# Patient Record
Sex: Female | Born: 1975 | Race: Black or African American | Hispanic: No | Marital: Single | Smoking: Former smoker
Health system: Southern US, Community
[De-identification: ages and names within clinical notes are randomized; demographics above are authoritative.]

## PROBLEM LIST (undated history)

## (undated) DIAGNOSIS — D219 Benign neoplasm of connective and other soft tissue, unspecified: Secondary | ICD-10-CM

---

## 2004-09-19 ENCOUNTER — Emergency Department: Payer: Self-pay | Admitting: Unknown Physician Specialty

## 2004-11-12 ENCOUNTER — Observation Stay: Payer: Self-pay

## 2005-01-25 ENCOUNTER — Inpatient Hospital Stay: Payer: Self-pay | Admitting: Obstetrics & Gynecology

## 2008-04-15 ENCOUNTER — Emergency Department: Payer: Self-pay | Admitting: Emergency Medicine

## 2008-12-06 ENCOUNTER — Emergency Department: Payer: Self-pay | Admitting: Emergency Medicine

## 2010-02-28 ENCOUNTER — Emergency Department: Payer: Self-pay | Admitting: Emergency Medicine

## 2011-07-22 ENCOUNTER — Emergency Department: Payer: Self-pay | Admitting: Emergency Medicine

## 2011-07-22 LAB — COMPREHENSIVE METABOLIC PANEL
Albumin: 3.8 g/dL (ref 3.4–5.0)
Alkaline Phosphatase: 55 U/L (ref 50–136)
Anion Gap: 9 (ref 7–16)
BUN: 13 mg/dL (ref 7–18)
Bilirubin,Total: 0.2 mg/dL (ref 0.2–1.0)
Calcium, Total: 9 mg/dL (ref 8.5–10.1)
Chloride: 105 mmol/L (ref 98–107)
Co2: 29 mmol/L (ref 21–32)
Creatinine: 0.91 mg/dL (ref 0.60–1.30)
EGFR (African American): >60
EGFR (Non-African Amer.): >60
Glucose: 83 mg/dL (ref 65–99)
Osmolality: 284 (ref 275–301)
Potassium: 3.8 mmol/L (ref 3.5–5.1)
SGOT(AST): 23 U/L (ref 15–37)
SGPT (ALT): 20 U/L
Sodium: 143 mmol/L (ref 136–145)
Total Protein: 8.2 g/dL (ref 6.4–8.2)

## 2011-07-22 LAB — URINALYSIS, COMPLETE
Bacteria: NONE SEEN
Bilirubin,UR: NEGATIVE
Blood: NEGATIVE
Glucose,UR: NEGATIVE mg/dL (ref 0–75)
Ketone: NEGATIVE
Leukocyte Esterase: NEGATIVE
Nitrite: NEGATIVE
Ph: 6 (ref 4.5–8.0)
Protein: NEGATIVE
RBC,UR: NONE SEEN /HPF (ref 0–5)
Specific Gravity: 1.026 (ref 1.003–1.030)
Squamous Epithelial: 1
WBC UR: <1 /HPF (ref 0–5)

## 2011-07-22 LAB — CBC
HCT: 37.1 % (ref 35.0–47.0)
HGB: 12.5 g/dL (ref 12.0–16.0)
MCH: 30.7 pg (ref 26.0–34.0)
MCHC: 33.7 g/dL (ref 32.0–36.0)
MCV: 91 fL (ref 80–100)
Platelet: 202 10*3/uL (ref 150–440)
RBC: 4.08 10*6/uL (ref 3.80–5.20)
RDW: 12.8 % (ref 11.5–14.5)
WBC: 4.9 10*3/uL (ref 3.6–11.0)

## 2011-07-22 LAB — HCG, QUANTITATIVE, PREGNANCY: Beta Hcg, Quant.: 2875 m[IU]/mL — ABNORMAL HIGH

## 2011-07-22 LAB — LIPASE, BLOOD: Lipase: 118 U/L (ref 73–393)

## 2011-07-22 LAB — PREGNANCY, URINE: Pregnancy Test, Urine: POSITIVE m[IU]/mL

## 2011-08-24 ENCOUNTER — Emergency Department: Payer: Self-pay | Admitting: Emergency Medicine

## 2011-08-24 LAB — BASIC METABOLIC PANEL
Anion Gap: 8 (ref 7–16)
BUN: 16 mg/dL (ref 7–18)
Calcium, Total: 9 mg/dL (ref 8.5–10.1)
Chloride: 102 mmol/L (ref 98–107)
Co2: 26 mmol/L (ref 21–32)
Creatinine: 0.88 mg/dL (ref 0.60–1.30)
EGFR (African American): >60
EGFR (Non-African Amer.): >60
Glucose: 79 mg/dL (ref 65–99)
Osmolality: 272 (ref 275–301)
Potassium: 3.8 mmol/L (ref 3.5–5.1)
Sodium: 136 mmol/L (ref 136–145)

## 2011-08-24 LAB — URINALYSIS, COMPLETE
Bacteria: NONE SEEN
Bilirubin,UR: NEGATIVE
Blood: NEGATIVE
Glucose,UR: NEGATIVE mg/dL (ref 0–75)
Leukocyte Esterase: NEGATIVE
Nitrite: NEGATIVE
Ph: 5 (ref 4.5–8.0)
Protein: NEGATIVE
RBC,UR: <1 /HPF (ref 0–5)
Specific Gravity: 1.024 (ref 1.003–1.030)
Squamous Epithelial: <1
WBC UR: 1 /HPF (ref 0–5)

## 2011-08-24 LAB — CBC
HCT: 36.6 % (ref 35.0–47.0)
HGB: 12.3 g/dL (ref 12.0–16.0)
MCH: 30.1 pg (ref 26.0–34.0)
MCHC: 33.5 g/dL (ref 32.0–36.0)
MCV: 90 fL (ref 80–100)
Platelet: 203 10*3/uL (ref 150–440)
RBC: 4.07 10*6/uL (ref 3.80–5.20)
RDW: 13 % (ref 11.5–14.5)
WBC: 6.1 10*3/uL (ref 3.6–11.0)

## 2011-08-24 LAB — HCG, QUANTITATIVE, PREGNANCY: Beta Hcg, Quant.: 45176 m[IU]/mL — ABNORMAL HIGH

## 2011-08-29 ENCOUNTER — Ambulatory Visit: Payer: Self-pay | Admitting: Obstetrics and Gynecology

## 2011-08-29 LAB — CBC
HCT: 33.9 % — ABNORMAL LOW (ref 35.0–47.0)
HGB: 11.6 g/dL — ABNORMAL LOW (ref 12.0–16.0)
MCH: 30.6 pg (ref 26.0–34.0)
MCHC: 34.2 g/dL (ref 32.0–36.0)
MCV: 90 fL (ref 80–100)
Platelet: 195 10*3/uL (ref 150–440)
RBC: 3.79 10*6/uL — ABNORMAL LOW (ref 3.80–5.20)
RDW: 13.1 % (ref 11.5–14.5)
WBC: 5.3 10*3/uL (ref 3.6–11.0)

## 2011-08-29 LAB — URINALYSIS, COMPLETE
Bilirubin,UR: NEGATIVE
Blood: NEGATIVE
Glucose,UR: NEGATIVE mg/dL (ref 0–75)
Leukocyte Esterase: NEGATIVE
Nitrite: NEGATIVE
Ph: 5 (ref 4.5–8.0)
Protein: NEGATIVE
RBC,UR: 2 /HPF (ref 0–5)
Specific Gravity: 1.031 (ref 1.003–1.030)
Squamous Epithelial: 1
WBC UR: <1 /HPF (ref 0–5)

## 2011-08-30 ENCOUNTER — Ambulatory Visit: Payer: Self-pay | Admitting: Obstetrics and Gynecology

## 2011-09-01 LAB — PATHOLOGY REPORT

## 2014-10-08 ENCOUNTER — Emergency Department
Admit: 2014-10-08 | Disposition: A | Discharge status: Home / Self Care | Payer: Self-pay | Admitting: Emergency Medicine

## 2014-10-08 LAB — COMPREHENSIVE METABOLIC PANEL
Albumin: 3.9 g/dL
Alkaline Phosphatase: 68 U/L
Anion Gap: 5 — ABNORMAL LOW (ref 7–16)
BUN: 15 mg/dL
Bilirubin,Total: <0.1 mg/dL — ABNORMAL LOW
Calcium, Total: 9 mg/dL
Chloride: 109 mmol/L
Co2: 27 mmol/L
Creatinine: 0.89 mg/dL
EGFR (African American): >60
EGFR (Non-African Amer.): >60
Glucose: 89 mg/dL
Potassium: 3.9 mmol/L
SGOT(AST): 18 U/L
SGPT (ALT): 11 U/L — ABNORMAL LOW
Sodium: 141 mmol/L
Total Protein: 7.8 g/dL

## 2014-10-08 LAB — CBC WITH DIFFERENTIAL/PLATELET
Basophil #: 0 10*3/uL (ref 0.0–0.1)
Basophil %: 0.5 %
Eosinophil #: 0.1 10*3/uL (ref 0.0–0.7)
Eosinophil %: 0.9 %
HCT: 39.1 % (ref 35.0–47.0)
HGB: 13.2 g/dL (ref 12.0–16.0)
Lymphocyte #: 1.8 10*3/uL (ref 1.0–3.6)
Lymphocyte %: 32.2 %
MCH: 30 pg (ref 26.0–34.0)
MCHC: 33.7 g/dL (ref 32.0–36.0)
MCV: 89 fL (ref 80–100)
Monocyte #: 0.7 x10 3/mm (ref 0.2–0.9)
Monocyte %: 11.9 %
Neutrophil #: 3.1 10*3/uL (ref 1.4–6.5)
Neutrophil %: 54.5 %
Platelet: 242 10*3/uL (ref 150–440)
RBC: 4.39 10*6/uL (ref 3.80–5.20)
RDW: 12.9 % (ref 11.5–14.5)
WBC: 5.6 10*3/uL (ref 3.6–11.0)

## 2014-10-08 LAB — URINALYSIS, COMPLETE
Bilirubin,UR: NEGATIVE
Blood: NEGATIVE
Glucose,UR: NEGATIVE mg/dL (ref 0–75)
Ketone: NEGATIVE
Leukocyte Esterase: NEGATIVE
Nitrite: NEGATIVE
Ph: 6 (ref 4.5–8.0)
Protein: NEGATIVE
RBC,UR: 1 /HPF (ref 0–5)
Specific Gravity: 1.023 (ref 1.003–1.030)
Squamous Epithelial: 9
WBC UR: 1 /HPF (ref 0–5)

## 2014-10-08 LAB — LIPASE, BLOOD: Lipase: 53 U/L — ABNORMAL HIGH

## 2014-10-26 NOTE — Op Note (Signed)
PATIENT NAME:  Janet Ayala, HARDIMAN MR#:  [id number] DATE OF BIRTH:  1976/05/17  DATE OF PROCEDURE:  08/30/2011  PREOPERATIVE DIAGNOSIS: Missed AB.   POSTOPERATIVE DIAGNOSIS: Missed AB.   PROCEDURE: Suction D and C.   ANESTHESIA: General.   SURGEON: Camarion Weier A. Weaver-Lee, MD    ESTIMATED BLOOD LOSS: 200 mL.  OPERATIVE FLUIDS: 1200 mL.   COMPLICATIONS: None.   FINDINGS: Products of conception.   INDICATIONS: The patient is a 39 year old who presents with some vaginal bleeding and findings of a fetal demise at 10 weeks. Risks, benefits, indications, and alternatives of the surgical procedure were explained and informed consent was obtained.   PROCEDURE: The patient was taken to the operating room with IV fluids running. She was prepped and draped in the usual sterile fashion in candy-cane stirrups. Speculum was placed inside the vagina. The anterior lip of the cervix was grasped with a single-tooth tenaculum. The uterus was dilated to 12 cm. A #10 suction curette was placed. Products of conception were removed. The uterus was curetted with a serrated edge banjo curette. The remainder of the products were suctioned. Bleeding was minimal at the time of the end of the procedure. 20 milliunits of Pitocin was added to the patient's IV fluids. All instrumentation was removed from the patient's vagina.     Sponge and instrument counts were correct x2. The patient was awakened from anesthesia and taken to the recovery room in stable condition.    ____________________________ Rolm Gala Ferne Reus, MD law:drc D: 08/30/2011 11:22:02 ET T: 08/30/2011 12:45:47 ET JOB#: [id number]  cc: Sherlynn Carbon A. Ferne Reus, MD, <Dictator> Rolm Gala WEAVER LEE MD ELECTRONICALLY SIGNED 09/15/2011 7:51

## 2015-04-05 ENCOUNTER — Emergency Department
Admission: EM | Admit: 2015-04-05 | Discharge: 2015-04-05 | Disposition: A | Discharge status: Home / Self Care | Payer: Self-pay | Attending: Emergency Medicine | Admitting: Emergency Medicine

## 2015-04-05 DIAGNOSIS — Z3202 Encounter for pregnancy test, result negative: Secondary | ICD-10-CM | POA: Insufficient documentation

## 2015-04-05 DIAGNOSIS — N390 Urinary tract infection, site not specified: Secondary | ICD-10-CM | POA: Insufficient documentation

## 2015-04-05 LAB — URINALYSIS COMPLETE WITH MICROSCOPIC (ARMC ONLY)
Bilirubin Urine: NEGATIVE
Glucose, UA: NEGATIVE mg/dL
Hgb urine dipstick: NEGATIVE
Ketones, ur: NEGATIVE mg/dL
Leukocytes, UA: NEGATIVE
Nitrite: POSITIVE — AB
Protein, ur: NEGATIVE mg/dL
Specific Gravity, Urine: 1.023 (ref 1.005–1.030)
pH: 6 (ref 5.0–8.0)

## 2015-04-05 LAB — POCT PREGNANCY, URINE: Preg Test, Ur: NEGATIVE

## 2015-04-05 MED ORDER — CIPROFLOXACIN HCL 500 MG PO TABS
ORAL_TABLET | ORAL | Status: AC
  Administered 2015-04-05: 500 mg via ORAL
  Filled 2015-04-05: qty 1

## 2015-04-05 MED ORDER — CIPROFLOXACIN HCL 500 MG PO TABS: 500.0000 mg | ORAL_TABLET | Freq: Two times a day (BID) | ORAL | Status: DC

## 2015-04-05 MED ORDER — PHENAZOPYRIDINE HCL 95 MG PO TABS: 95.0000 mg | ORAL_TABLET | Freq: Three times a day (TID) | ORAL | Status: DC | PRN

## 2015-04-05 MED ORDER — IBUPROFEN 800 MG PO TABS
800.0000 mg | ORAL_TABLET | Freq: Once | ORAL | Status: AC
  Administered 2015-04-05: 800 mg via ORAL
  Filled 2015-04-05: qty 1

## 2015-04-05 MED ORDER — CIPROFLOXACIN HCL 500 MG PO TABS
500.0000 mg | ORAL_TABLET | Freq: Once | ORAL | Status: AC
  Administered 2015-04-05: 500 mg via ORAL

## 2015-04-05 NOTE — ED Notes (Signed)
POC pregnancy test performed by NT Sarah resulted as negative

## 2015-04-05 NOTE — ED Provider Notes (Signed)
[facility] Emergency Department Provider Note  ____________________________________________  Time seen: Approximately 7:09 AM  I have reviewed the triage vital signs and the nursing notes.   HISTORY  Chief Complaint Back Pain and Urinary Frequency    HPI Janet Ayala is a 39 y.o. female presents to the emergency room for a 2 to three-week history of right low back pain and urinary frequency. She also reports occasional dysuria as well as a foul odor. Patient denies fever/chills, nausea, vomiting, diarrhea, constipation. She has not taken any medications for same.   No past medical history on file.  There are no active problems to display for this patient.   No past surgical history on file.  Current Outpatient Rx  Name  Route  Sig  Dispense  Refill  . ciprofloxacin (CIPRO) 500 MG tablet   Oral   Take 1 tablet (500 mg total) by mouth 2 (two) times daily.   10 tablet   0   . phenazopyridine (PYRIDIUM) 95 MG tablet   Oral   Take 1 tablet (95 mg total) by mouth 3 (three) times daily as needed for pain.   6 tablet   0     Allergies Sulfa antibiotics  No family history on file.  Social History Social History  Substance Use Topics  . Smoking status: Not on file  . Smokeless tobacco: Not on file  . Alcohol Use: Not on file    Review of Systems Constitutional: No fever/chills Eyes: No visual changes. ENT: No sore throat. Cardiovascular: Denies chest pain. Respiratory: Denies shortness of breath. Gastrointestinal: No abdominal pain.  No nausea, no vomiting.  No diarrhea.  No constipation. Genitourinary: Positive for intermittent dysuria, polyuria. Musculoskeletal: Negative for back pain. Skin: Negative for rash. Neurological: Negative for headaches, focal weakness or numbness.  10-point ROS otherwise negative.  ____________________________________________   PHYSICAL EXAM:  VITAL SIGNS: ED Triage Vitals  Enc Vitals Group      BP 04/05/15 0521 113/73 mmHg     Pulse Rate 04/05/15 0521 85     Resp 04/05/15 0521 18     Temp 04/05/15 0521 98.5 F (36.9 C)     Temp Source 04/05/15 0521 Oral     SpO2 04/05/15 0521 97 %     Weight 04/05/15 0521 208 lb (94.348 kg)     Height 04/05/15 0521 5\' 7"  (1.702 m)     Head Cir --      Peak Flow --      Pain Score 04/05/15 0521 6     Pain Loc --      Pain Edu? --      Excl. in [place]? --     Constitutional: Alert and oriented. Well appearing and in no acute distress. Eyes: Conjunctivae are normal. PERRL. EOMI. Head: Atraumatic. Nose: No congestion/rhinnorhea. Mouth/Throat: Mucous membranes are moist.  Oropharynx non-erythematous. Neck: No stridor.   Hematological/Lymphatic/Immunilogical: No cervical lymphadenopathy. Cardiovascular: Normal rate, regular rhythm. Grossly normal heart sounds.  Good peripheral circulation. Respiratory: Normal respiratory effort.  No retractions. Lungs CTAB. Gastrointestinal: Soft and nontender. No distention. No abdominal bruits. No CVA tenderness. Musculoskeletal: No lower extremity tenderness nor edema.  No joint effusions. Neurologic:  Normal speech and language. No gross focal neurologic deficits are appreciated. No gait instability. Skin:  Skin is warm, dry and intact. No rash noted. Psychiatric: Mood and affect are normal. Speech and behavior are normal.  ____________________________________________   LABS (all labs ordered are listed, but only abnormal results are  displayed)  Labs Reviewed  URINALYSIS COMPLETEWITH MICROSCOPIC ([place]) - Abnormal; Notable for the following:    Color, Urine YELLOW (*)    APPearance HAZY (*)    Nitrite POSITIVE (*)    Bacteria, UA MANY (*)    Squamous Epithelial / LPF 0-5 (*)    All other components within normal limits  POC URINE PREG, ED  POCT PREGNANCY, URINE    ____________________________________________  EKG   ____________________________________________  RADIOLOGY   ____________________________________________   PROCEDURES  Procedure(s) performed: None  Critical Care performed: No  ____________________________________________   INITIAL IMPRESSION / ASSESSMENT AND PLAN / ED COURSE  Pertinent labs & imaging results that were available during my care of the patient were reviewed by me and considered in my medical decision making (see chart for details).  Patient's history, symptoms, exam, and laboratory results are consistent with a urinary tract infection. Adding antibiotics for same. Advised patient of diagnosis and treatment plan. She verbalized understanding and compliance with same. Advised the patient she could take Tylenol, ibuprofen, prescribed Pyridium for symptom relief. Advised patient also take a probiotic and drink and creased amount of fluids. Patient to follow-up with her primary care should symptoms persist. ____________________________________________   FINAL CLINICAL IMPRESSION(S) / ED DIAGNOSES  Final diagnoses:  UTI (lower urinary tract infection)      Charline Bills Cuthriell, PA-C 04/05/15 [place], MD 04/05/15 [phone]

## 2015-04-05 NOTE — Discharge Instructions (Signed)
°  Urinary Tract Infection Urinary tract infections (UTIs) can develop anywhere along your urinary tract. Your urinary tract is your body's drainage system for removing wastes and extra water. Your urinary tract includes two kidneys, two ureters, a bladder, and a urethra. Your kidneys are a pair of bean-shaped organs. Each kidney is about the size of your fist. They are located below your ribs, one on each side of your spine. CAUSES Infections are caused by microbes, which are microscopic organisms, including fungi, viruses, and bacteria. These organisms are so small that they can only be seen through a microscope. Bacteria are the microbes that most commonly cause UTIs. SYMPTOMS  Symptoms of UTIs may vary by age and gender of the patient and by the location of the infection. Symptoms in young women typically include a frequent and intense urge to urinate and a painful, burning feeling in the bladder or urethra during urination. Older women and men are more likely to be tired, shaky, and weak and have muscle aches and abdominal pain. A fever may mean the infection is in your kidneys. Other symptoms of a kidney infection include pain in your back or sides below the ribs, nausea, and vomiting. DIAGNOSIS To diagnose a UTI, your caregiver will ask you about your symptoms. Your caregiver also will ask to provide a urine sample. The urine sample will be tested for bacteria and white blood cells. White blood cells are made by your body to help fight infection. TREATMENT  Typically, UTIs can be treated with medication. Because most UTIs are caused by a bacterial infection, they usually can be treated with the use of antibiotics. The choice of antibiotic and length of treatment depend on your symptoms and the type of bacteria causing your infection. HOME CARE INSTRUCTIONS  If you were prescribed antibiotics, take them exactly as your caregiver instructs you. Finish the medication even if you feel better after you  have only taken some of the medication.  Drink enough water and fluids to keep your urine clear or pale yellow.  Avoid caffeine, tea, and carbonated beverages. They tend to irritate your bladder.  Empty your bladder often. Avoid holding urine for long periods of time.  Empty your bladder before and after sexual intercourse.  After a bowel movement, women should cleanse from front to back. Use each tissue only once. SEEK MEDICAL CARE IF:   You have back pain.  You develop a fever.  Your symptoms do not begin to resolve within 3 days. SEEK IMMEDIATE MEDICAL CARE IF:   You have severe back pain or lower abdominal pain.  You develop chills.  You have nausea or vomiting.  You have continued burning or discomfort with urination. MAKE SURE YOU:   Understand these instructions.  Will watch your condition.  Will get help right away if you are not doing well or get worse. Document Released: 03/30/2005 Document Revised: 12/20/2011 Document Reviewed: 07/29/2011 [facility] Patient Information 2015 [place], [place]. This information is not intended to replace advice given to you by your health care provider. Make sure you discuss any questions you have with your health care provider.  Take entire course of antibiotics. Take a probiotic while you're on the antibiotic to prevent upset stomach and a yeast infection. Drink plenty of fluids. He may take Tylenol and ibuprofen as well as the peridium for symptom control

## 2015-04-05 NOTE — ED Notes (Signed)
States she developed lower back pain couple of weeks ago w/o injury  But has had some urinary sx's   Foul smell. Ambulates well to treatment room

## 2015-04-05 NOTE — ED Notes (Addendum)
Pt c/o foul odor to her urine only in the am for 2-3 weeks; right low back pain; urinary frequency; pt's LMP was March 2016 after taking her last depo shot

## 2015-07-23 ENCOUNTER — Emergency Department
Admission: EM | Admit: 2015-07-23 | Discharge: 2015-07-23 | Disposition: A | Discharge status: Home / Self Care | Payer: Medicaid Other | Attending: Emergency Medicine | Admitting: Emergency Medicine

## 2015-07-23 ENCOUNTER — Encounter: Payer: Self-pay | Admitting: Emergency Medicine

## 2015-07-23 ENCOUNTER — Emergency Department: Payer: Medicaid Other

## 2015-07-23 DIAGNOSIS — N939 Abnormal uterine and vaginal bleeding, unspecified: Secondary | ICD-10-CM | POA: Insufficient documentation

## 2015-07-23 DIAGNOSIS — Z87891 Personal history of nicotine dependence: Secondary | ICD-10-CM | POA: Insufficient documentation

## 2015-07-23 DIAGNOSIS — Z792 Long term (current) use of antibiotics: Secondary | ICD-10-CM | POA: Insufficient documentation

## 2015-07-23 DIAGNOSIS — Z3202 Encounter for pregnancy test, result negative: Secondary | ICD-10-CM | POA: Insufficient documentation

## 2015-07-23 DIAGNOSIS — R140 Abdominal distension (gaseous): Secondary | ICD-10-CM | POA: Insufficient documentation

## 2015-07-23 LAB — COMPREHENSIVE METABOLIC PANEL
ALT: 20 U/L (ref 14–54)
AST: 25 U/L (ref 15–41)
Albumin: 4.3 g/dL (ref 3.5–5.0)
Alkaline Phosphatase: 66 U/L (ref 38–126)
Anion gap: 8 (ref 5–15)
BUN: 15 mg/dL (ref 6–20)
CO2: 27 mmol/L (ref 22–32)
Calcium: 9.3 mg/dL (ref 8.9–10.3)
Chloride: 104 mmol/L (ref 101–111)
Creatinine, Ser: 0.83 mg/dL (ref 0.44–1.00)
GFR calc Af Amer: >60 mL/min (ref >60)
GFR calc non Af Amer: >60 mL/min (ref >60)
Glucose, Bld: 76 mg/dL (ref 65–99)
Potassium: 3.7 mmol/L (ref 3.5–5.1)
Sodium: 139 mmol/L (ref 135–145)
Total Bilirubin: 0.9 mg/dL (ref 0.3–1.2)
Total Protein: 8.5 g/dL — ABNORMAL HIGH (ref 6.5–8.1)

## 2015-07-23 LAB — CBC WITH DIFFERENTIAL/PLATELET
Basophils Absolute: 0 10*3/uL (ref 0–0.1)
Basophils Relative: 0 %
Eosinophils Absolute: 0 10*3/uL (ref 0–0.7)
Eosinophils Relative: 0 %
HCT: 41.9 % (ref 35.0–47.0)
Hemoglobin: 13.9 g/dL (ref 12.0–16.0)
Lymphocytes Relative: 33 %
Lymphs Abs: 1.4 10*3/uL (ref 1.0–3.6)
MCH: 29.3 pg (ref 26.0–34.0)
MCHC: 33.3 g/dL (ref 32.0–36.0)
MCV: 88 fL (ref 80.0–100.0)
Monocytes Absolute: 0.5 10*3/uL (ref 0.2–0.9)
Monocytes Relative: 11 %
Neutro Abs: 2.5 10*3/uL (ref 1.4–6.5)
Neutrophils Relative %: 56 %
Platelets: 234 10*3/uL (ref 150–440)
RBC: 4.76 MIL/uL (ref 3.80–5.20)
RDW: 12.9 % (ref 11.5–14.5)
WBC: 4.4 10*3/uL (ref 3.6–11.0)

## 2015-07-23 LAB — POCT PREGNANCY, URINE: Preg Test, Ur: NEGATIVE

## 2015-07-23 NOTE — ED Provider Notes (Signed)
[facility] Emergency Department Provider Note   ____________________________________________  Time seen: 1605  I have reviewed the triage vital signs and the nursing notes.   HISTORY  Chief Complaint Bloated   History limited by: Not Limited   HPI Katianna T Youlanda Mighty is a 40 y.o. female with no significant past medical history who presents to the emergency department today because of concern for abdominal bloating and vaginal spotting. The patient states that the symptoms have been going on for the past month. They have both been constant. The swelling has gotten a little worse. The patient denies any abdominal pain with the swelling. Denies similar symptoms in the past. No fevers. Nothing worse about the swelling or vaginal bleeding today that made her decide to come in.    History reviewed. No pertinent past medical history.  There are no active problems to display for this patient.   History reviewed. No pertinent past surgical history.  Current Outpatient Rx  Name  Route  Sig  Dispense  Refill  . ciprofloxacin (CIPRO) 500 MG tablet   Oral   Take 1 tablet (500 mg total) by mouth 2 (two) times daily.   10 tablet   0   . phenazopyridine (PYRIDIUM) 95 MG tablet   Oral   Take 1 tablet (95 mg total) by mouth 3 (three) times daily as needed for pain.   6 tablet   0     Allergies Sulfa antibiotics  No family history on file.  Social History Social History  Substance Use Topics  . Smoking status: Former Smoker    Quit date: 02/19/2006  . Smokeless tobacco: None  . Alcohol Use: No    Review of Systems  Constitutional: Negative for fever. Cardiovascular: Negative for chest pain. Respiratory: Negative for shortness of breath. Gastrointestinal: Negative for abdominal pain, vomiting and diarrhea. Positive for abdominal distention.  Neurological: Negative for headaches, focal weakness or numbness.  10-point ROS otherwise  negative.  ____________________________________________   PHYSICAL EXAM:  VITAL SIGNS: ED Triage Vitals  Enc Vitals Group     BP 07/23/15 1409 134/73 mmHg     Pulse Rate 07/23/15 1409 85     Resp --      Temp 07/23/15 1409 97.9 F (36.6 C)     Temp Source 07/23/15 1409 Oral     SpO2 07/23/15 1409 99 %     Weight 07/23/15 1409 217 lb (98.431 kg)     Height 07/23/15 1409 5\' 7"  (1.702 m)   Constitutional: Alert and oriented. Well appearing and in no distress. Eyes: Conjunctivae are normal. PERRL. Normal extraocular movements. ENT   Head: Normocephalic and atraumatic.   Nose: No congestion/rhinnorhea.   Mouth/Throat: Mucous membranes are moist.   Neck: No stridor. Hematological/Lymphatic/Immunilogical: No cervical lymphadenopathy. Cardiovascular: Normal rate, regular rhythm.  No murmurs, rubs, or gallops. Respiratory: Normal respiratory effort without tachypnea nor retractions. Breath sounds are clear and equal bilaterally. No wheezes/rales/rhonchi. Gastrointestinal: Soft and nontender. Mildly distended.  Genitourinary: Deferred Musculoskeletal: Normal range of motion in all extremities. No joint effusions.  No lower extremity tenderness nor edema. Neurologic:  Normal speech and language. No gross focal neurologic deficits are appreciated.  Skin:  Skin is warm, dry and intact. No rash noted. Psychiatric: Mood and affect are normal. Speech and behavior are normal. Patient exhibits appropriate insight and judgment.  ____________________________________________    LABS (pertinent positives/negatives)  Labs Reviewed  COMPREHENSIVE METABOLIC PANEL - Abnormal; Notable for the following:    Total Protein  8.5 (*)    All other components within normal limits  CBC WITH DIFFERENTIAL/PLATELET  PREGNANCY, URINE  POCT PREGNANCY, URINE     ____________________________________________   EKG  None  ____________________________________________    RADIOLOGY  US  transvaginal IMPRESSION: 1. Small lesion in the left ovary, favored to represent an involuting dominant follicle. However, this is slightly irregular, and warrants attention on repeat transvaginal ultrasound in 6-12 weeks to ensure the resolution of this finding. 2. Small volume of free fluid in the cul-de-sac, likely physiologic.  ____________________________________________   PROCEDURES  Procedure(s) performed: None  Critical Care performed: No  ____________________________________________   INITIAL IMPRESSION / ASSESSMENT AND PLAN / ED COURSE  Pertinent labs & imaging results that were available during my care of the patient were reviewed by me and considered in my medical decision making (see chart for details).  Patient presented to the emergency department today because of concerns for vaginal spotting and abdominal bloating. On exam patient's abdomen might of been slightly distended however was nontender. No rebound or guarding. No tympany. Patient had a transvaginal ultrasound performed which did not show any obvious source of bleeding but showed a small possible involuting follicle. I did discuss this finding with the patient and the importance of following up to get a repeat ultrasound in 6-12 weeks. Blood work without any concerning findings. At this point unclear etiology however did encourage OB/GYN follow-up. Feel patient is safe for discharge home.  ____________________________________________   FINAL CLINICAL IMPRESSION(S) / ED DIAGNOSES  Final diagnoses:  Abnormal vaginal bleeding     Nance Pear, MD 07/23/15 [phone]

## 2015-07-23 NOTE — ED Notes (Signed)
Pt states abd bloating and and vaginal spotting for 3 days, denies any nausea

## 2015-07-23 NOTE — ED Notes (Signed)
Urine pregnancy test negative. Abd. Distended, non tender. + bowel sounds.

## 2015-07-23 NOTE — ED Notes (Signed)
Patient here complaining of abdominal swelling X 1 month. Spotty vaginal bleeding starting on 1/16 - here because "I'm still spotting" States her last "normal" period was in November 2016. Denies any pain.  Abdomen visibly distended. Denies any bowel irregularity.

## 2015-07-23 NOTE — Discharge Instructions (Signed)
As we discussed it is important that get a repeat US to evaluate for the ovarian cyst that we saw today. This should be done in 6-12 weeks. This can be arranged by your primary care doctor or ob/gyn doctor. Please seek medical attention for any high fevers, chest pain, shortness of breath, change in behavior, persistent vomiting, bloody stool or any other new or concerning symptoms.   Abnormal Uterine Bleeding Abnormal uterine bleeding can affect women at various stages in life, including teenagers, women in their reproductive years, pregnant women, and women who have reached menopause. Several kinds of uterine bleeding are considered abnormal, including:  Bleeding or spotting between periods.   Bleeding after sexual intercourse.   Bleeding that is heavier or more than normal.   Periods that last longer than usual.  Bleeding after menopause.  Many cases of abnormal uterine bleeding are minor and simple to treat, while others are more serious. Any type of abnormal bleeding should be evaluated by your health care provider. Treatment will depend on the cause of the bleeding. HOME CARE INSTRUCTIONS Monitor your condition for any changes. The following actions may help to alleviate any discomfort you are experiencing:  Avoid the use of tampons and douches as directed by your health care provider.  Change your pads frequently. You should get regular pelvic exams and Pap tests. Keep all follow-up appointments for diagnostic tests as directed by your health care provider.  SEEK MEDICAL CARE IF:   Your bleeding lasts more than 1 week.   You feel dizzy at times.  SEEK IMMEDIATE MEDICAL CARE IF:   You pass out.   You are changing pads every 15 to 30 minutes.   You have abdominal pain.  You have a fever.   You become sweaty or weak.   You are passing large blood clots from the vagina.   You start to feel nauseous and vomit. MAKE SURE YOU:   Understand these  instructions.  Will watch your condition.  Will get help right away if you are not doing well or get worse.   This information is not intended to replace advice given to you by your health care provider. Make sure you discuss any questions you have with your health care provider.   Document Released: 06/20/2005 Document Revised: 06/25/2013 Document Reviewed: 01/17/2013 Elsevier Interactive Patient Education [employer].

## 2018-04-09 ENCOUNTER — Emergency Department
Admission: EM | Admit: 2018-04-09 | Discharge: 2018-04-09 | Disposition: A | Discharge status: Home / Self Care | Payer: Self-pay | Attending: Emergency Medicine | Admitting: Emergency Medicine

## 2018-04-09 ENCOUNTER — Other Ambulatory Visit: Payer: Self-pay

## 2018-04-09 ENCOUNTER — Emergency Department: Payer: Self-pay

## 2018-04-09 DIAGNOSIS — N80129 Deep endometriosis of ovary, unspecified ovary: Secondary | ICD-10-CM

## 2018-04-09 DIAGNOSIS — Z87891 Personal history of nicotine dependence: Secondary | ICD-10-CM | POA: Insufficient documentation

## 2018-04-09 DIAGNOSIS — R102 Pelvic and perineal pain: Secondary | ICD-10-CM | POA: Insufficient documentation

## 2018-04-09 DIAGNOSIS — N809 Endometriosis, unspecified: Secondary | ICD-10-CM | POA: Insufficient documentation

## 2018-04-09 DIAGNOSIS — Z79899 Other long term (current) drug therapy: Secondary | ICD-10-CM | POA: Insufficient documentation

## 2018-04-09 LAB — CBC WITH DIFFERENTIAL/PLATELET
Basophils Absolute: 0 10*3/uL (ref 0–0.1)
Basophils Relative: 0 %
Eosinophils Absolute: 0.1 10*3/uL (ref 0–0.7)
Eosinophils Relative: 2 %
HCT: 38.5 % (ref 35.0–47.0)
Hemoglobin: 12.7 g/dL (ref 12.0–16.0)
Lymphocytes Relative: 37 %
Lymphs Abs: 1.4 10*3/uL (ref 1.0–3.6)
MCH: 29.5 pg (ref 26.0–34.0)
MCHC: 33 g/dL (ref 32.0–36.0)
MCV: 89.4 fL (ref 80.0–100.0)
Monocytes Absolute: 0.5 10*3/uL (ref 0.2–0.9)
Monocytes Relative: 13 %
Neutro Abs: 1.9 10*3/uL (ref 1.4–6.5)
Neutrophils Relative %: 48 %
Platelets: 254 10*3/uL (ref 150–440)
RBC: 4.3 MIL/uL (ref 3.80–5.20)
RDW: 12.7 % (ref 11.5–14.5)
WBC: 3.9 10*3/uL (ref 3.6–11.0)

## 2018-04-09 LAB — BASIC METABOLIC PANEL
Anion gap: 8 (ref 5–15)
BUN: 13 mg/dL (ref 6–20)
CO2: 27 mmol/L (ref 22–32)
Calcium: 8.9 mg/dL (ref 8.9–10.3)
Chloride: 106 mmol/L (ref 98–111)
Creatinine, Ser: 0.91 mg/dL (ref 0.44–1.00)
GFR calc Af Amer: >60 mL/min (ref >60)
GFR calc non Af Amer: >60 mL/min (ref >60)
Glucose, Bld: 98 mg/dL (ref 70–99)
Potassium: 3.6 mmol/L (ref 3.5–5.1)
Sodium: 141 mmol/L (ref 135–145)

## 2018-04-09 LAB — URINALYSIS, COMPLETE (UACMP) WITH MICROSCOPIC
Bacteria, UA: NONE SEEN
Bilirubin Urine: NEGATIVE
Glucose, UA: NEGATIVE mg/dL
Hgb urine dipstick: NEGATIVE
Ketones, ur: NEGATIVE mg/dL
Leukocytes, UA: NEGATIVE
Nitrite: NEGATIVE
Protein, ur: NEGATIVE mg/dL
Specific Gravity, Urine: 1.025 (ref 1.005–1.030)
pH: 6 (ref 5.0–8.0)

## 2018-04-09 LAB — LIPASE, BLOOD: Lipase: 26 U/L (ref 11–51)

## 2018-04-09 LAB — POCT PREGNANCY, URINE: Preg Test, Ur: NEGATIVE

## 2018-04-09 MED ORDER — MORPHINE SULFATE (PF) 4 MG/ML IV SOLN
4.0000 mg | Freq: Once | INTRAVENOUS | Status: AC
  Administered 2018-04-09: 4 mg via INTRAVENOUS
  Filled 2018-04-09: qty 1

## 2018-04-09 MED ORDER — IOPAMIDOL (ISOVUE-300) INJECTION 61%
100.0000 mL | Freq: Once | INTRAVENOUS | Status: AC | PRN
  Administered 2018-04-09: 100 mL via INTRAVENOUS

## 2018-04-09 MED ORDER — ONDANSETRON HCL 4 MG/2ML IJ SOLN
INTRAMUSCULAR | Status: AC
  Filled 2018-04-09: qty 2

## 2018-04-09 MED ORDER — TRAMADOL HCL 50 MG PO TABS: 50.0000 mg | ORAL_TABLET | Freq: Four times a day (QID) | ORAL | 0 refills | Status: DC | PRN

## 2018-04-09 MED ORDER — NAPROXEN 500 MG PO TABS: 500.0000 mg | ORAL_TABLET | Freq: Two times a day (BID) | ORAL | 2 refills | Status: DC

## 2018-04-09 MED ORDER — MORPHINE SULFATE (PF) 4 MG/ML IV SOLN
4.0000 mg | Freq: Once | INTRAVENOUS | Status: AC
  Administered 2018-04-09: 4 mg via INTRAVENOUS

## 2018-04-09 MED ORDER — ONDANSETRON HCL 4 MG/2ML IJ SOLN
4.0000 mg | Freq: Once | INTRAMUSCULAR | Status: AC
  Administered 2018-04-09: 4 mg via INTRAVENOUS

## 2018-04-09 MED ORDER — MORPHINE SULFATE (PF) 4 MG/ML IV SOLN
INTRAVENOUS | Status: AC
  Filled 2018-04-09: qty 1

## 2018-04-09 NOTE — ED Notes (Signed)
Patient transported to CT 

## 2018-04-09 NOTE — ED Notes (Signed)
IV attempted x 2 by this RN. 

## 2018-04-09 NOTE — ED Provider Notes (Signed)
[facility] Emergency Department Provider Note   ____________________________________________    I have reviewed the triage vital signs and the nursing notes.   HISTORY  Chief Complaint Abdominal Pain     HPI Janet Ayala is a 42 y.o. female who presents with complaints of right lower quadrant abdominal pain.  Patient reports that she was to having the pain mildly several days ago during her menstrual cycle but it is increased significantly today to where it hurts to move.  She denies fevers or chills.  Positive nausea.  Reports normal stools.  No history of similar pain.  No history of abdominal surgery.  Has not taken anything for this.  Pain does not radiate.  She describes it as a sharp pain.   History reviewed. No pertinent past medical history.  There are no active problems to display for this patient.   History reviewed. No pertinent surgical history.  Prior to Admission medications   Medication Sig Start Date End Date Taking? Authorizing Provider  ciprofloxacin (CIPRO) 500 MG tablet Take 1 tablet (500 mg total) by mouth 2 (two) times daily. 04/05/15   Cuthriell, Charline Bills, PA-C  naproxen (NAPROSYN) 500 MG tablet Take 1 tablet (500 mg total) by mouth 2 (two) times daily with a meal. 04/09/18   Lavonia Drafts, MD  phenazopyridine (PYRIDIUM) 95 MG tablet Take 1 tablet (95 mg total) by mouth 3 (three) times daily as needed for pain. 04/05/15   Cuthriell, Charline Bills, PA-C  traMADol (ULTRAM) 50 MG tablet Take 1 tablet (50 mg total) by mouth every 6 (six) hours as needed. 04/09/18 04/09/19  Lavonia Drafts, MD     Allergies Sulfa antibiotics  No family history on file.  Social History Social History   Tobacco Use  . Smoking status: Former Smoker    Last attempt to quit: 02/19/2006    Years since quitting: 12.1  Substance Use Topics  . Alcohol use: No  . Drug use: Not on file    Review of Systems  Constitutional: No fever/chills Eyes: No  visual changes.  ENT: No sore throat. Cardiovascular: Denies chest pain. Respiratory: Denies shortness of breath. Gastrointestinal: As above Genitourinary: Negative for dysuria.  No vaginal discharge Musculoskeletal: Negative for back pain. Skin: Negative for rash. Neurological: Negative for headaches    ____________________________________________   PHYSICAL EXAM:  VITAL SIGNS: ED Triage Vitals  Enc Vitals Group     BP 04/09/18 0624 (!) 108/97     Pulse Rate 04/09/18 0624 77     Resp 04/09/18 0624 18     Temp 04/09/18 0624 98.5 F (36.9 C)     Temp Source 04/09/18 0624 Oral     SpO2 04/09/18 0624 99 %     Weight 04/09/18 0625 95.7 kg (211 lb)     Height 04/09/18 0625 1.702 m (5\' 7" )     Head Circumference --      Peak Flow --      Pain Score 04/09/18 0624 9     Pain Loc --      Pain Edu? --      Excl. in [place]? --     Constitutional: Alert and oriented.   Mouth/Throat: Mucous membranes are moist.   Neck:  Painless ROM Cardiovascular: Normal rate, regular rhythm. Grossly normal heart sounds.  Good peripheral circulation. Respiratory: Normal respiratory effort.  No retractions. Lungs CTAB. Gastrointestinal: Tenderness to palpation in the right lower quadrant, no distention, no CVA tenderness  Musculoskeletal: .  Warm and well perfused Neurologic:  Normal speech and language. No gross focal neurologic deficits are appreciated.  Skin:  Skin is warm, dry and intact. No rash noted. Psychiatric: Mood and affect are normal. Speech and behavior are normal.  ____________________________________________   LABS (all labs ordered are listed, but only abnormal results are displayed)  Labs Reviewed  URINALYSIS, COMPLETE (UACMP) WITH MICROSCOPIC - Abnormal; Notable for the following components:      Result Value   Color, Urine YELLOW (*)    APPearance CLEAR (*)    All other components within normal limits  CBC WITH DIFFERENTIAL/PLATELET  LIPASE, BLOOD  BASIC METABOLIC  PANEL  POCT PREGNANCY, URINE   ____________________________________________  EKG  None ____________________________________________  RADIOLOGY  CT abdomen pelvis ____________________________________________   PROCEDURES  Procedure(s) performed: No  Procedures   Critical Care performed: No ____________________________________________   INITIAL IMPRESSION / ASSESSMENT AND PLAN / ED COURSE  Pertinent labs & imaging results that were available during my care of the patient were reviewed by me and considered in my medical decision making (see chart for details).  Patient presents with right lower quadrant tenderness to palpation/pain.  Lab work is overall reassuring however suspicious for appendicitis versus ureterolithiasis.  Could also be related to ovarian cyst, will start with IV morphine, IV Zofran, CT abdomen pelvis and reevaluate  CT scan negative for appendicitis, possible ovarian cyst noted ultrasound the pelvis ordered with Dopplers  Ultrasound demonstrates likely endometrioma, no evidence of torsion.  Discussed ultrasound results with patient, at this time she is completely pain-free.  Emphasized the need for GYN follow-up and repeat ultrasound   ____________________________________________   FINAL CLINICAL IMPRESSION(S) / ED DIAGNOSES  Final diagnoses:  Pelvic pain  Endometrioma        Note:  This document was prepared using Dragon voice recognition software and may include unintentional dictation errors.    Lavonia Drafts, MD 04/09/18 [phone]

## 2018-04-09 NOTE — ED Triage Notes (Signed)
Patient to ED with acute onset of RLQ pain about two hours ago. Patient finds it difficult to stand straight up due to the pain. Denies N/V.

## 2018-04-09 NOTE — ED Notes (Signed)
Pt still in ultrasound.

## 2018-04-09 NOTE — ED Notes (Signed)
Pt alert and oriented X4, active, cooperative, pt in NAD. RR even and unlabored, color WNL.  Pt informed to return if any life threatening symptoms occur.  Discharge and followup instructions reviewed. Ambulates safe.  

## 2018-04-09 NOTE — ED Notes (Signed)
Patient transported to Ultrasound 

## 2018-04-11 ENCOUNTER — Encounter: Payer: Self-pay | Admitting: Obstetrics & Gynecology

## 2018-04-11 ENCOUNTER — Ambulatory Visit (INDEPENDENT_AMBULATORY_CARE_PROVIDER_SITE_OTHER): Payer: Self-pay | Admitting: Obstetrics & Gynecology

## 2018-04-11 VITALS — BP 102/70 | Ht 67.0 in | Wt 208.0 lb

## 2018-04-11 DIAGNOSIS — N9489 Other specified conditions associated with female genital organs and menstrual cycle: Secondary | ICD-10-CM | POA: Insufficient documentation

## 2018-04-11 DIAGNOSIS — R1031 Right lower quadrant pain: Secondary | ICD-10-CM

## 2018-04-11 DIAGNOSIS — R102 Pelvic and perineal pain: Secondary | ICD-10-CM

## 2018-04-11 NOTE — Progress Notes (Signed)
  HPI: Patient is a 42 y.o. [id number] who LMP was Patient's last menstrual period was 04/06/2018., presents today for a problem visit.  She complains of recent findings of Right adnexal mass by Ultrasound - Pelvic Vaginal, CT - Pelvis.  Pt has had symptoms of pain.  Pain began 2 weeks ago, no radiation, located RLQ, severe at times, sharp and shooting, no h/o fibroids or cysts.  No recent hormone use, Depo use years ago for contraception.  Not wanting pregnancy.  Previous evaluation: emergency room visit on 04/09/2018. Prior Diagnosis: CT and [place] suggestive of 2.5cm right adenxal mass, unclear if involving right ovary . Previous Treatment: NSAID and Tramadol  PMHx: She  has no past medical history on file. Also,  has no past surgical history on file., family history is not on file.,  reports that she quit smoking about 12 years ago. She has never used smokeless tobacco. She reports that she does not drink alcohol or use drugs.  She has a current medication list which includes the following prescription(s): naproxen, tramadol, ciprofloxacin, and phenazopyridine. Also, is allergic to sulfa antibiotics.  Review of Systems  Constitutional: Negative for chills, fever and malaise/fatigue.  HENT: Negative for congestion, sinus pain and sore throat.   Eyes: Negative for blurred vision and pain.  Respiratory: Negative for cough and wheezing.   Cardiovascular: Negative for chest pain and leg swelling.  Gastrointestinal: Negative for abdominal pain, constipation, diarrhea, heartburn, nausea and vomiting.  Genitourinary: Negative for dysuria, frequency, hematuria and urgency.  Musculoskeletal: Negative for back pain, joint pain, myalgias and neck pain.  Skin: Negative for itching and rash.  Neurological: Negative for dizziness, tremors and weakness.  Endo/Heme/Allergies: Does not bruise/bleed easily.  Psychiatric/Behavioral: Negative for depression. The patient is not nervous/anxious and does not have  insomnia.     Objective: BP 102/70   Ht 5\' 7"  (1.702 m)   Wt 208 lb (94.3 kg)   LMP 04/06/2018   BMI 32.58 kg/m  Physical Exam  Constitutional: She is oriented to person, place, and time. She appears well-developed and well-nourished. No distress.  Abdominal: Soft. Normal appearance and bowel sounds are normal. There is generalized tenderness. There is no rigidity, no rebound, no guarding, no tenderness at McBurney's point and negative Murphy's sign.  Musculoskeletal: Normal range of motion.  Neurological: She is alert and oriented to person, place, and time.  Skin: Skin is warm and dry.  Psychiatric: She has a normal mood and affect.  Vitals reviewed.  ASSESSMENT/PLAN:   Problem List Items Addressed This Visit      Other   Adnexal mass - Primary   Relevant Orders   US PELVIS TRANSVANGINAL NON-OB (TV ONLY)   Pelvic pain    Other Visit Diagnoses    RLQ abdominal pain        [place] follow up in 2 weeks, sooner if pain becomes severe again Surgery options d/w pt if worsens or if mass persists over time Fibroid, endometrioma, dermoid, other etiologies discussed Cont NSAIDs and Tramadol for pain as it has helped  No BC, has been on Depo in past and may consider again No desire for pregnancy Unsure if would want BTL    If Medicaid would need 30 d papers\  Barnett Applebaum, MD, [place], [facility] 04/11/2018  4:34 PM

## 2018-04-11 NOTE — Patient Instructions (Signed)
Pelvic Mass A pelvic mass is an abnormal growth in the pelvis. The pelvis is the area between your hip bones. It includes the bladder and the rectum in males and females, and also the uterus and ovaries in females. What are the causes? Many things can cause a pelvic mass, including:  Cancer.  Fibroids of the uterus.  Ovarian cysts.  Infection.  Ectopic pregnancy.  What are the signs or symptoms? Symptoms of a pelvic mass may include:  Cramping.  Nausea.  Diarrhea.  Fever.  Vomiting.  Weakness.  Pain in the pelvis, side, or back.  Weight loss.  Constipation.  Problems with vaginal bleeding, including: ? Light or heavy bleeding with or without blood clots. ? Irregular menstruation. ? Pain with menstruation.  Problems with urination, including: ? Frequent urination. ? Inability to empty the bladder completely. ? Urinating very small amounts. ? Pain with urination. ? Bloody urine.  Some pelvic masses do not cause symptoms. How is this diagnosed? To make a diagnosis, your health care provider will need to learn more about the mass. You may have tests or procedures done, such as:  Blood tests.  X-rays.  Ultrasound.  CT scan.  MRI.  A surgery to look inside of your abdomen with cameras (laparoscopy).  A biopsy that is performed with a needle or during laparoscopy or surgery.  In some cases, what seemed like a pelvic mass may actually be something else, such as a mass in one of the organs that are near the pelvis, an infection (abscess) or scar tissue (adhesions) that formed after a surgery. How is this treated? Treatment will depend on the cause of the mass. Follow these instructions at home: What you need to do at home will depend on the cause of the mass. Follow the instructions that your health care provider gives to you. In general:  Keep all follow-up visits as directed by your health care provider. This is important.  Take medicines only as  directed by your health care provider.  Follow any restrictions that are given to you by your health care provider.  Contact a health care provider if:  You develop new symptoms. Get help right away if:  You vomit bright red blood or vomit material that looks like coffee grounds.  You have blood in your stools, or the stools turn black and tarry.  You have an abnormal or increased amount of vaginal bleeding.  You have a fever.  You develop easy bruising or bleeding.  You develop sudden or worsening pain that is not controlled by your medicine.  You feel worsening weakness, or you have a fainting episode.  You feel that the mass has suddenly gotten larger.  You develop severe bloating in your abdomen or your pelvis.  You cannot pass any urine.  You are unable to have a bowel movement. This information is not intended to replace advice given to you by your health care provider. Make sure you discuss any questions you have with your health care provider. Document Released: 09/27/2006 Document Revised: 11/26/2015 Document Reviewed: 02/03/2014 Elsevier Interactive Patient Education  2018 [employer].

## 2018-04-25 ENCOUNTER — Ambulatory Visit (INDEPENDENT_AMBULATORY_CARE_PROVIDER_SITE_OTHER): Payer: Self-pay

## 2018-04-25 ENCOUNTER — Ambulatory Visit (INDEPENDENT_AMBULATORY_CARE_PROVIDER_SITE_OTHER): Payer: Self-pay | Admitting: Obstetrics & Gynecology

## 2018-04-25 ENCOUNTER — Encounter: Payer: Self-pay | Admitting: Obstetrics & Gynecology

## 2018-04-25 VITALS — BP 110/70 | Ht 67.0 in | Wt 210.0 lb

## 2018-04-25 DIAGNOSIS — N9489 Other specified conditions associated with female genital organs and menstrual cycle: Secondary | ICD-10-CM

## 2018-04-25 DIAGNOSIS — N83292 Other ovarian cyst, left side: Secondary | ICD-10-CM

## 2018-04-25 DIAGNOSIS — D219 Benign neoplasm of connective and other soft tissue, unspecified: Secondary | ICD-10-CM

## 2018-04-25 DIAGNOSIS — R1031 Right lower quadrant pain: Secondary | ICD-10-CM

## 2018-04-25 MED ORDER — MEDROXYPROGESTERONE ACETATE 150 MG/ML IM SUSP: 150.0000 mg | INTRAMUSCULAR | 3 refills | Status: DC

## 2018-04-25 NOTE — Progress Notes (Signed)
  HPI: Pain has improved.  She did have severe RLQ pain.  Mass seen on [place] In hospital.  Here for follow up.  Also, desires contraception.  Ultrasound demonstrates 2 cm probable right sided fibroid.  No adnexal mass. The uterus is anteverted and measures 9.3 x 6.1 x 5.5cm. Anterior uterine wall is thick and hypervascular compared to posterior uterus. Questionable adenomyosis. Echo texture is heterogenous with evidence of focal mass within the right uterus measuring 2.1 x 2.3 x 1.9cm. Questionable fibroid vs other uterine mass.  PMHx: She  has no past medical history on file. Also,  has no past surgical history on file., family history is not on file.,  reports that she quit smoking about 12 years ago. She has never used smokeless tobacco. She reports that she does not drink alcohol or use drugs.  She has a current medication list which includes the following prescription(s): ciprofloxacin, naproxen, phenazopyridine, and tramadol. Also, is allergic to sulfa antibiotics.  Review of Systems  All other systems reviewed and are negative.   Objective: BP 110/70   Ht 5\' 7"  (1.702 m)   Wt 210 lb (95.3 kg)   LMP 04/06/2018   BMI 32.89 kg/m   Physical examination Constitutional NAD, Conversant  Skin No rashes, lesions or ulceration.   Extremities: Moves all appropriately.  Normal ROM for age. No lymphadenopathy.  Neuro: Grossly intact  Psych: Oriented to PPT.  Normal mood. Normal affect.   Assessment:  Fibroid RLQ abdominal pain  Monitor for pain Depo for contraception desired    Info provided.  Pros and cons.  A total of 15 minutes were spent face-to-face with the patient during this encounter and over half of that time dealt with counseling and coordination of care.  Barnett Applebaum, MD, [facility], [facility] 04/25/2018  4:00 PM

## 2018-04-25 NOTE — Patient Instructions (Signed)

## 2018-05-03 ENCOUNTER — Emergency Department: Payer: Self-pay

## 2018-05-03 ENCOUNTER — Emergency Department
Admission: EM | Admit: 2018-05-03 | Discharge: 2018-05-03 | Disposition: A | Discharge status: Home / Self Care | Payer: Self-pay | Attending: Student in an Organized Health Care Education/Training Program | Admitting: Student in an Organized Health Care Education/Training Program

## 2018-05-03 DIAGNOSIS — Z79899 Other long term (current) drug therapy: Secondary | ICD-10-CM | POA: Insufficient documentation

## 2018-05-03 DIAGNOSIS — Z87891 Personal history of nicotine dependence: Secondary | ICD-10-CM | POA: Insufficient documentation

## 2018-05-03 DIAGNOSIS — N809 Endometriosis, unspecified: Secondary | ICD-10-CM | POA: Insufficient documentation

## 2018-05-03 DIAGNOSIS — R1031 Right lower quadrant pain: Secondary | ICD-10-CM

## 2018-05-03 DIAGNOSIS — N80129 Deep endometriosis of ovary, unspecified ovary: Secondary | ICD-10-CM

## 2018-05-03 HISTORY — DX: Benign neoplasm of connective and other soft tissue, unspecified: D21.9

## 2018-05-03 LAB — COMPREHENSIVE METABOLIC PANEL
ALT: 13 U/L (ref 0–44)
AST: 20 U/L (ref 15–41)
Albumin: 4 g/dL (ref 3.5–5.0)
Alkaline Phosphatase: 71 U/L (ref 38–126)
Anion gap: 9 (ref 5–15)
BUN: 22 mg/dL — ABNORMAL HIGH (ref 6–20)
CO2: 26 mmol/L (ref 22–32)
Calcium: 9.4 mg/dL (ref 8.9–10.3)
Chloride: 104 mmol/L (ref 98–111)
Creatinine, Ser: 0.93 mg/dL (ref 0.44–1.00)
GFR calc Af Amer: >60 mL/min (ref >60)
GFR calc non Af Amer: >60 mL/min (ref >60)
Glucose, Bld: 91 mg/dL (ref 70–99)
Potassium: 3.9 mmol/L (ref 3.5–5.1)
Sodium: 139 mmol/L (ref 135–145)
Total Bilirubin: 0.4 mg/dL (ref 0.3–1.2)
Total Protein: 8.4 g/dL — ABNORMAL HIGH (ref 6.5–8.1)

## 2018-05-03 LAB — URINALYSIS, COMPLETE (UACMP) WITH MICROSCOPIC
Bacteria, UA: NONE SEEN
Bilirubin Urine: NEGATIVE
Glucose, UA: NEGATIVE mg/dL
Ketones, ur: NEGATIVE mg/dL
Leukocytes, UA: NEGATIVE
Nitrite: NEGATIVE
Protein, ur: NEGATIVE mg/dL
Specific Gravity, Urine: 1.027 (ref 1.005–1.030)
pH: 6 (ref 5.0–8.0)

## 2018-05-03 LAB — CBC
HCT: 39.4 % (ref 36.0–46.0)
Hemoglobin: 13.1 g/dL (ref 12.0–15.0)
MCH: 29.4 pg (ref 26.0–34.0)
MCHC: 33.2 g/dL (ref 30.0–36.0)
MCV: 88.3 fL (ref 80.0–100.0)
Platelets: 273 10*3/uL (ref 150–400)
RBC: 4.46 MIL/uL (ref 3.87–5.11)
RDW: 12.1 % (ref 11.5–15.5)
WBC: 3.9 10*3/uL — ABNORMAL LOW (ref 4.0–10.5)
nRBC: 0 % (ref 0.0–0.2)

## 2018-05-03 LAB — LIPASE, BLOOD: Lipase: 30 U/L (ref 11–51)

## 2018-05-03 LAB — POCT PREGNANCY, URINE: Preg Test, Ur: NEGATIVE

## 2018-05-03 MED ORDER — NAPROXEN 500 MG PO TABS: 500.0000 mg | ORAL_TABLET | Freq: Two times a day (BID) | ORAL | 2 refills | Status: DC

## 2018-05-03 MED ORDER — PROMETHAZINE HCL 25 MG/ML IJ SOLN
12.5000 mg | Freq: Four times a day (QID) | INTRAMUSCULAR | Status: DC | PRN
  Administered 2018-05-03: 12.5 mg via INTRAVENOUS
  Filled 2018-05-03: qty 1

## 2018-05-03 MED ORDER — KETOROLAC TROMETHAMINE 30 MG/ML IJ SOLN
15.0000 mg | Freq: Once | INTRAMUSCULAR | Status: AC
  Administered 2018-05-03: 15 mg via INTRAVENOUS
  Filled 2018-05-03: qty 1

## 2018-05-03 MED ORDER — HYDROCODONE-ACETAMINOPHEN 5-325 MG PO TABS: 1.0000 | ORAL_TABLET | ORAL | 0 refills | Status: DC | PRN

## 2018-05-03 MED ORDER — HALOPERIDOL LACTATE 5 MG/ML IJ SOLN
5.0000 mg | Freq: Once | INTRAMUSCULAR | Status: AC
  Administered 2018-05-03: 5 mg via INTRAVENOUS
  Filled 2018-05-03: qty 1

## 2018-05-03 MED ORDER — SODIUM CHLORIDE 0.9 % IV BOLUS
500.0000 mL | Freq: Once | INTRAVENOUS | Status: AC
  Administered 2018-05-03: 500 mL via INTRAVENOUS

## 2018-05-03 MED ORDER — MORPHINE SULFATE (PF) 4 MG/ML IV SOLN
4.0000 mg | INTRAVENOUS | Status: DC | PRN
  Administered 2018-05-03: 4 mg via INTRAVENOUS
  Filled 2018-05-03: qty 1

## 2018-05-03 NOTE — ED Provider Notes (Signed)
[facility] Emergency Department Provider Note    First MD Initiated Contact with Patient 05/03/18 [phone]     (approximate)  I have reviewed the triage vital signs and the nursing notes.   HISTORY  Chief Complaint Abdominal Pain    HPI Janet Ayala is a 42 y.o. female presents the ER for worsening but recurrent right lower quadrant pain.  Patient recently diagnosed with uterine fibroid in the right lower quadrant seen by OB/GYN.  Has had extensive work-up in the past weeks with CT imaging as well as repeat transvaginal ultrasound.  Patient scheduled for outpatient follow-up but had worsening pain.  States she is no longer bleeding like she was previously.  Denies any chance of being pregnant.  Denies any discharge.  No dysuria.  No fevers.  No nausea.  Reports pain is moderate to severe.    Past Medical History:  Diagnosis Date  . Fibroids    No family history on file. History reviewed. No pertinent surgical history. Patient Active Problem List   Diagnosis Date Noted  . Adnexal mass 04/11/2018  . Pelvic pain 04/11/2018      Prior to Admission medications   Medication Sig Start Date End Date Taking? Authorizing Provider  HYDROcodone-acetaminophen (NORCO) 5-325 MG tablet Take 1 tablet by mouth every 4 (four) hours as needed for moderate pain. 05/03/18   Merlyn Lot, MD  medroxyPROGESTERone (DEPO-PROVERA) 150 MG/ML injection Inject 1 mL (150 mg total) into the muscle every 3 (three) months. 04/25/18   Gae Dry, MD  naproxen (NAPROSYN) 500 MG tablet Take 1 tablet (500 mg total) by mouth 2 (two) times daily with a meal. 05/03/18   Merlyn Lot, MD    Allergies Sulfa antibiotics    Social History Social History   Tobacco Use  . Smoking status: Former Smoker    Last attempt to quit: 02/19/2006    Years since quitting: 12.2  . Smokeless tobacco: Never Used  Substance Use Topics  . Alcohol use: No  . Drug use: Never     Review of Systems Patient denies headaches, rhinorrhea, blurry vision, numbness, shortness of breath, chest pain, edema, cough, abdominal pain, nausea, vomiting, diarrhea, dysuria, fevers, rashes or hallucinations unless otherwise stated above in HPI. ____________________________________________   PHYSICAL EXAM:  VITAL SIGNS: Vitals:   05/03/18 1110 05/03/18 1111  BP: 114/76   Pulse:  63  Resp: 15 18  Temp:    SpO2:  100%    Constitutional: Alert and oriented.  Eyes: Conjunctivae are normal.  Head: Atraumatic. Nose: No congestion/rhinnorhea. Mouth/Throat: Mucous membranes are moist.   Neck: No stridor. Painless ROM.  Cardiovascular: Normal rate, regular rhythm. Grossly normal heart sounds.  Good peripheral circulation. Respiratory: Normal respiratory effort.  No retractions. Lungs CTAB. Gastrointestinal: Soft and nontender, but patient holding rlq. No peritonitis, no hernia No distention. No abdominal bruits. No CVA tenderness. Genitourinary: deferred Musculoskeletal: No lower extremity tenderness nor edema.  No joint effusions. Neurologic:  Normal speech and language. No gross focal neurologic deficits are appreciated. No facial droop Skin:  Skin is warm, dry and intact. No rash noted. Psychiatric: Mood and affect are anxious. Speech and behavior are normal.  ____________________________________________   LABS (all labs ordered are listed, but only abnormal results are displayed)  Results for orders placed or performed during the hospital encounter of 05/03/18 (from the past 24 hour(s))  Lipase, blood     Status: None   Collection Time: 05/03/18  6:54 AM  Result  Value Ref Range   Lipase 30 11 - 51 U/L  Comprehensive metabolic panel     Status: Abnormal   Collection Time: 05/03/18  6:54 AM  Result Value Ref Range   Sodium 139 135 - 145 mmol/L   Potassium 3.9 3.5 - 5.1 mmol/L   Chloride 104 98 - 111 mmol/L   CO2 26 22 - 32 mmol/L   Glucose, Bld 91 70 - 99 mg/dL    BUN 22 (H) 6 - 20 mg/dL   Creatinine, Ser 0.93 0.44 - 1.00 mg/dL   Calcium 9.4 8.9 - 10.3 mg/dL   Total Protein 8.4 (H) 6.5 - 8.1 g/dL   Albumin 4.0 3.5 - 5.0 g/dL   AST 20 15 - 41 U/L   ALT 13 0 - 44 U/L   Alkaline Phosphatase 71 38 - 126 U/L   Total Bilirubin 0.4 0.3 - 1.2 mg/dL   GFR calc non Af Amer >60 >60 mL/min   GFR calc Af Amer >60 >60 mL/min   Anion gap 9 5 - 15  CBC     Status: Abnormal   Collection Time: 05/03/18  6:54 AM  Result Value Ref Range   WBC 3.9 (L) 4.0 - 10.5 K/uL   RBC 4.46 3.87 - 5.11 MIL/uL   Hemoglobin 13.1 12.0 - 15.0 g/dL   HCT 39.4 36.0 - 46.0 %   MCV 88.3 80.0 - 100.0 fL   MCH 29.4 26.0 - 34.0 pg   MCHC 33.2 30.0 - 36.0 g/dL   RDW 12.1 11.5 - 15.5 %   Platelets 273 150 - 400 K/uL   nRBC 0.0 0.0 - 0.2 %  Urinalysis, Complete w Microscopic     Status: Abnormal   Collection Time: 05/03/18  6:54 AM  Result Value Ref Range   Color, Urine YELLOW (A) YELLOW   APPearance CLEAR (A) CLEAR   Specific Gravity, Urine 1.027 1.005 - 1.030   pH 6.0 5.0 - 8.0   Glucose, UA NEGATIVE NEGATIVE mg/dL   Hgb urine dipstick SMALL (A) NEGATIVE   Bilirubin Urine NEGATIVE NEGATIVE   Ketones, ur NEGATIVE NEGATIVE mg/dL   Protein, ur NEGATIVE NEGATIVE mg/dL   Nitrite NEGATIVE NEGATIVE   Leukocytes, UA NEGATIVE NEGATIVE   RBC / HPF 0-5 0 - 5 RBC/hpf   WBC, UA 0-5 0 - 5 WBC/hpf   Bacteria, UA NONE SEEN NONE SEEN   Squamous Epithelial / LPF 0-5 0 - 5   Mucus PRESENT   Pregnancy, urine POC     Status: None   Collection Time: 05/03/18  7:01 AM  Result Value Ref Range   Preg Test, Ur NEGATIVE NEGATIVE   ____________________________________________  EKG ED ECG REPORT I, Merlyn Lot, the attending physician, personally viewed and interpreted this ECG.   Date: 05/03/2018  EKG Time: 8:48  Rate: 60  Rhythm: normal EKG, normal sinus rhythm, unchanged from previous tracings  Axis: normal  Intervals:normal  ST&T Change: nonsepcific st  abn  ____________________________________________  RADIOLOGY  I personally reviewed all radiographic images ordered to evaluate for the above acute complaints and reviewed radiology reports and findings.  These findings were personally discussed with the patient.  Please see medical record for radiology report.  ____________________________________________   PROCEDURES  Procedure(s) performed:  Procedures    Critical Care performed: no ____________________________________________   INITIAL IMPRESSION / ASSESSMENT AND PLAN / ED COURSE  Pertinent labs & imaging results that were available during my care of the patient were reviewed by me and considered  in my medical decision making (see chart for details).   DDX: Fibroid, torsion, cyst, MSK strain, hernia, doubt appendicitis or UTI, doubt nephrolithiasis   Kinsly T Youlanda Mighty is a 42 y.o. who presents to the ED with symptoms as described above.  Patient with recent extensive work-up similar presentation.  She is afebrile doubt appendicitis or infectious process but will repeat ultrasound and repeat blood work.  We will give IV fluids and IV pain medication she does appear very uncomfortable.  Clinical Course as of May 03 1210  Thu May 03, 2018  0940 Patient rechecked.  Repeat abdominal exam is soft and benign.  Patient states the pain is completely relieved.  Not consistent with torsion.  Not consistent with appendicitis.  No evidence of UTI.  Doubt nephrolithiasis.   [PR]  [phone] Patient was able to tolerate PO and was able to ambulate with a steady gait.    [PR]    Clinical Course User Index [PR] Merlyn Lot, MD     As part of my medical decision making, I reviewed the following data within the [facility] notes reviewed and incorporated, Labs reviewed, notes from prior ED visits and [place] Controlled Substance Database   ____________________________________________   FINAL CLINICAL IMPRESSION(S) /  ED DIAGNOSES  Final diagnoses:  Abdominal pain, RLQ  Endometrioma      NEW MEDICATIONS STARTED DURING THIS VISIT:  Discharge Medication List as of 05/03/2018 10:02 AM    START taking these medications   Details  HYDROcodone-acetaminophen (NORCO) 5-325 MG tablet Take 1 tablet by mouth every 4 (four) hours as needed for moderate pain., Starting Thu 05/03/2018, Print         Note:  This document was prepared using Dragon voice recognition software and may include unintentional dictation errors.    Merlyn Lot, MD 05/03/18 [phone]

## 2018-05-03 NOTE — Discharge Instructions (Addendum)
You have been seen in the emergency department for emergency care. It is important that you contact your own doctor, specialist or the closest clinic for follow-up care. Please bring this instruction sheet, all medications and X-ray copies with you when you are seen for follow-up care.  Determining the exact cause for all patients with abdominal pain is extremely difficult in the emergency department. Our primary focus is to rule-out immediate life-threatening diseases. If no immediate source of pain is found the definitive diagnosis frequently needs to be determined over time.Many times your primary care physician can determine the cause by following the symptoms over time. Sometimes, specialist are required such as Gastroenterologists, Gynecologists, Urologists or Surgeons. Please return immediately to the Emergency Department for fever>101, Vomiting or Intractable Pain. You should return to the emergency department or see your primary care provider in 12-24hrs if your pain is no better and sooner if your pain becomes worse.  IMPRESSION: 1. Suspected endometrioma in the right adnexal region, slightly smaller than on recent ultrasound examination. This lesion currently measures 1.8 x 1.5 x 1.6 cm.   2. Probable hemorrhagic cyst measuring 1.4 x 1.1 x 1.2 cm within the left ovary. Short-interval follow up ultrasound in 6-12 weeks is recommended, preferably during the week following the patient's normal menses.   3. Inhomogeneous appearance to the uterus, suggesting leiomyomatous change without well-defined mass. No endometrial thickening. No free pelvic fluid.   4.  No findings suggesting ovarian torsion.

## 2018-05-03 NOTE — ED Triage Notes (Signed)
Patient c/o TAE abdominal pain. Patient dx with uterine fibroids on 10/23 by Kenton Kingfisher at [place]. Patient denies urinary changes and bleeding

## 2018-05-03 NOTE — ED Notes (Signed)
Patient calling ride to pick her up

## 2018-06-13 ENCOUNTER — Telehealth: Payer: Self-pay | Admitting: Obstetrics & Gynecology

## 2018-06-13 NOTE — Telephone Encounter (Signed)
[facility] referring for uterine Fibroids. Called and left voicemail for patient to call back to be schedule. Left voice mail on  06/11/18

## 2018-06-13 NOTE — Telephone Encounter (Signed)
Letter to be sent

## 2018-07-05 ENCOUNTER — Ambulatory Visit: Payer: Medicaid Other | Admitting: Obstetrics and Gynecology

## 2018-07-26 ENCOUNTER — Ambulatory Visit: Payer: Medicaid Other | Admitting: Obstetrics and Gynecology

## 2019-09-17 ENCOUNTER — Other Ambulatory Visit: Payer: Self-pay | Admitting: Family Medicine

## 2019-09-17 DIAGNOSIS — Z1231 Encounter for screening mammogram for malignant neoplasm of breast: Secondary | ICD-10-CM

## 2019-12-03 ENCOUNTER — Ambulatory Visit
Admission: RE | Admit: 2019-12-03 | Discharge: 2019-12-03 | Disposition: A | Discharge status: Home / Self Care | Payer: Medicaid Other | Source: Ambulatory Visit | Attending: Family Medicine | Admitting: Family Medicine

## 2019-12-03 ENCOUNTER — Other Ambulatory Visit: Payer: Self-pay | Admitting: Family Medicine

## 2019-12-03 DIAGNOSIS — R928 Other abnormal and inconclusive findings on diagnostic imaging of breast: Secondary | ICD-10-CM

## 2019-12-03 DIAGNOSIS — Z1231 Encounter for screening mammogram for malignant neoplasm of breast: Secondary | ICD-10-CM | POA: Diagnosis not present

## 2019-12-03 DIAGNOSIS — N6310 Unspecified lump in the right breast, unspecified quadrant: Secondary | ICD-10-CM

## 2019-12-03 DIAGNOSIS — N6489 Other specified disorders of breast: Secondary | ICD-10-CM

## 2019-12-17 ENCOUNTER — Other Ambulatory Visit: Payer: Self-pay | Admitting: Family Medicine

## 2019-12-17 DIAGNOSIS — N6489 Other specified disorders of breast: Secondary | ICD-10-CM

## 2019-12-17 DIAGNOSIS — R928 Other abnormal and inconclusive findings on diagnostic imaging of breast: Secondary | ICD-10-CM

## 2019-12-17 DIAGNOSIS — R921 Mammographic calcification found on diagnostic imaging of breast: Secondary | ICD-10-CM

## 2019-12-18 ENCOUNTER — Other Ambulatory Visit: Payer: Medicaid Other

## 2019-12-18 ENCOUNTER — Ambulatory Visit: Payer: Medicaid Other

## 2019-12-23 ENCOUNTER — Ambulatory Visit
Admission: RE | Admit: 2019-12-23 | Discharge: 2019-12-23 | Disposition: A | Discharge status: Home / Self Care | Payer: Medicaid Other | Source: Ambulatory Visit | Attending: Family Medicine | Admitting: Family Medicine

## 2019-12-23 DIAGNOSIS — R921 Mammographic calcification found on diagnostic imaging of breast: Secondary | ICD-10-CM | POA: Diagnosis present

## 2019-12-23 DIAGNOSIS — N6489 Other specified disorders of breast: Secondary | ICD-10-CM | POA: Insufficient documentation

## 2019-12-23 DIAGNOSIS — R928 Other abnormal and inconclusive findings on diagnostic imaging of breast: Secondary | ICD-10-CM | POA: Insufficient documentation

## 2019-12-24 ENCOUNTER — Other Ambulatory Visit: Payer: Self-pay | Admitting: Family Medicine

## 2019-12-24 DIAGNOSIS — R921 Mammographic calcification found on diagnostic imaging of breast: Secondary | ICD-10-CM

## 2019-12-24 DIAGNOSIS — N6489 Other specified disorders of breast: Secondary | ICD-10-CM

## 2019-12-24 DIAGNOSIS — R928 Other abnormal and inconclusive findings on diagnostic imaging of breast: Secondary | ICD-10-CM

## 2019-12-25 ENCOUNTER — Ambulatory Visit
Admission: RE | Admit: 2019-12-25 | Discharge: 2019-12-25 | Disposition: A | Discharge status: Home / Self Care | Payer: Medicaid Other | Source: Ambulatory Visit | Attending: Family Medicine | Admitting: Family Medicine

## 2019-12-25 DIAGNOSIS — R921 Mammographic calcification found on diagnostic imaging of breast: Secondary | ICD-10-CM | POA: Diagnosis present

## 2019-12-25 DIAGNOSIS — R928 Other abnormal and inconclusive findings on diagnostic imaging of breast: Secondary | ICD-10-CM | POA: Diagnosis present

## 2019-12-25 DIAGNOSIS — N6489 Other specified disorders of breast: Secondary | ICD-10-CM | POA: Insufficient documentation

## 2019-12-25 HISTORY — PX: BREAST BIOPSY: SHX20

## 2019-12-26 LAB — SURGICAL PATHOLOGY

## 2019-12-27 ENCOUNTER — Encounter: Payer: Self-pay | Admitting: *Deleted

## 2019-12-27 NOTE — Progress Notes (Signed)
Received message from Electa Sniff at  [facility] that patient needed a surgical referral.  Talked to patient and she would like to go to [facility].  I have scheduled her to see Dr. Lysle Pearl on 12/31/19 @ 10:45.

## 2020-05-11 ENCOUNTER — Other Ambulatory Visit: Payer: Self-pay | Admitting: Surgery

## 2020-05-11 DIAGNOSIS — D241 Benign neoplasm of right breast: Secondary | ICD-10-CM

## 2020-06-25 ENCOUNTER — Other Ambulatory Visit: Payer: Medicaid Other

## 2020-07-07 ENCOUNTER — Ambulatory Visit
Admission: RE | Admit: 2020-07-07 | Discharge: 2020-07-07 | Disposition: A | Discharge status: Home / Self Care | Payer: Medicaid Other | Source: Ambulatory Visit | Attending: Surgery | Admitting: Surgery

## 2020-07-07 ENCOUNTER — Other Ambulatory Visit: Payer: Self-pay

## 2020-07-07 DIAGNOSIS — D241 Benign neoplasm of right breast: Secondary | ICD-10-CM | POA: Diagnosis present

## 2020-08-03 IMAGING — US US BREAST*R* LIMITED INC AXILLA
1 series · 4 of 4 positions shown · non-contrast
Comparison: Screening study, baseline exam, 12/03/2019.

CLINICAL DATA: Screening recall for a possible mass with
calcifications in the right breast.

EXAM:
DIGITAL DIAGNOSTIC RIGHT MAMMOGRAM WITH CAD AND TOMO
ULTRASOUND RIGHT BREAST

[Series 1: us breast*right* limited inc axilla · 0.09mm/px · 4 of 4 slices shown]
[im 1/4]
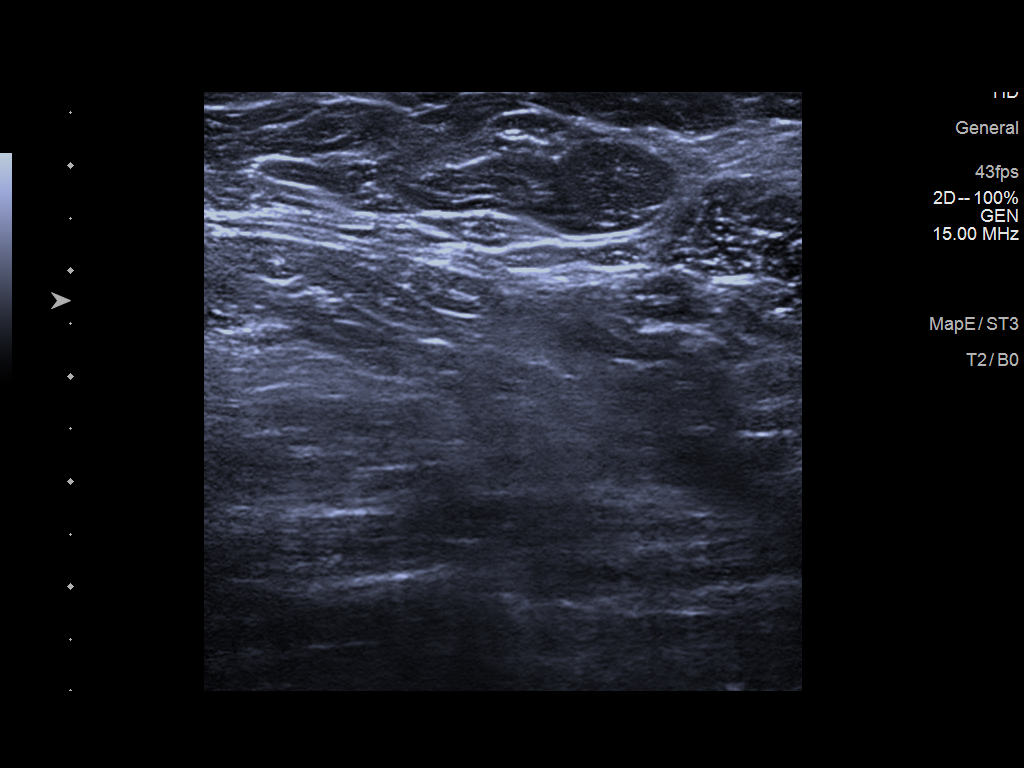
[im 2/4]
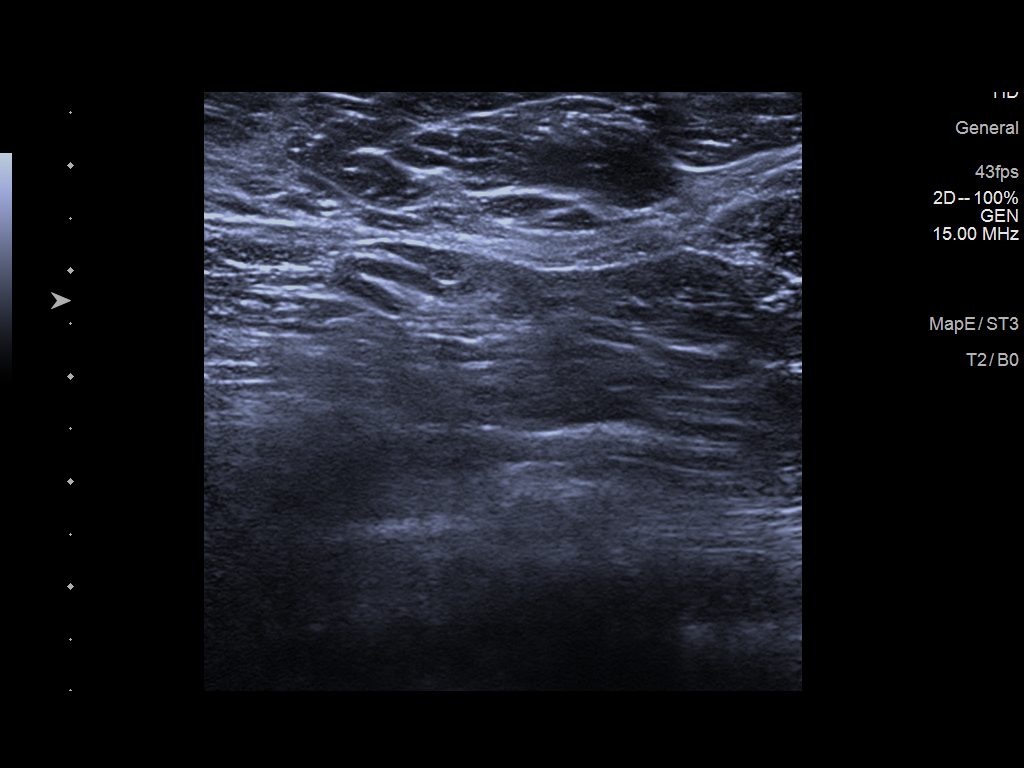
[im 3/4]
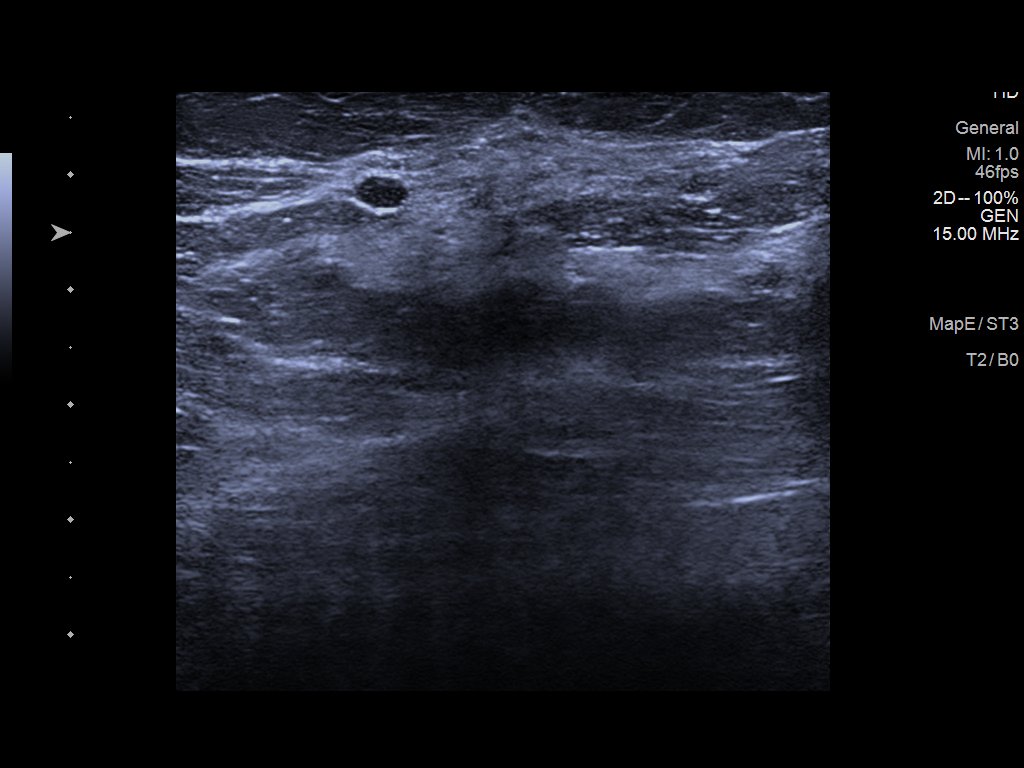
[im 4/4]
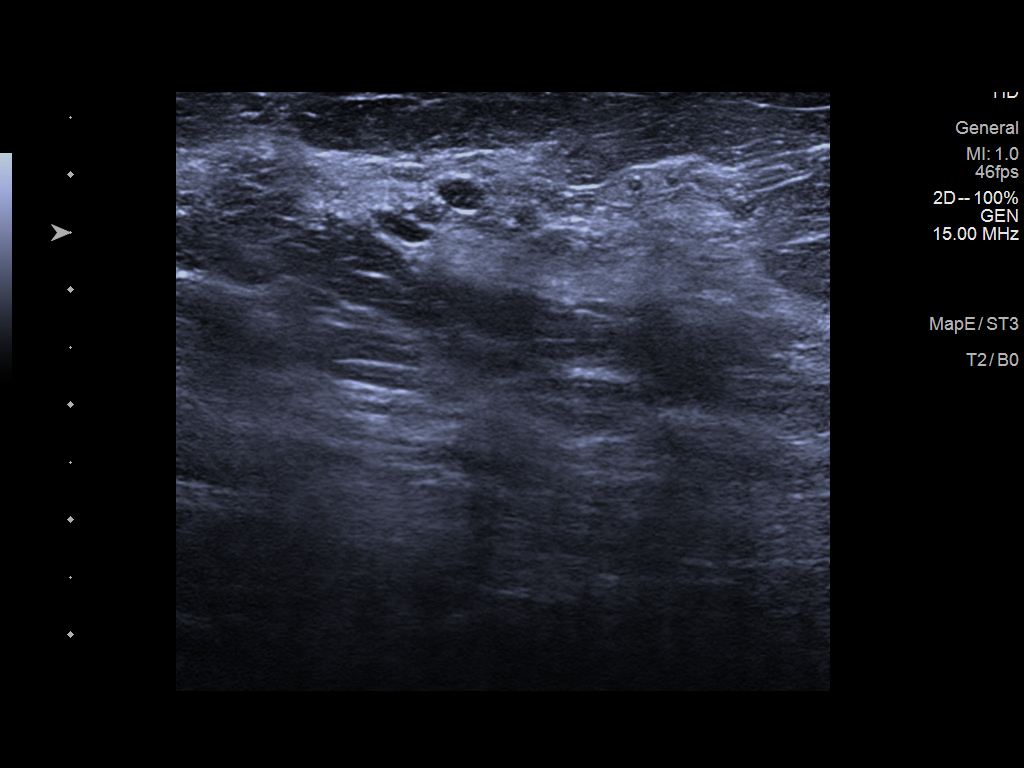

[4 of 4 positions shown; findings below may reference images not displayed]

ACR Breast Density Category b: There are scattered areas of
fibroglandular density.
FINDINGS: The possible mass noted on the screening study partly disperses on
spot compression imaging. There is a residual focal opacity with
ill-defined margins and questionable associated architectural
distortion. Within this area there are calcifications at are partly
round and punctate and partly elongated in configurations, with some
apparent linearity, but no branching. The persistent opacity and
associated calcifications lie in the medial right breast.
Calcifications span 3.5 x 1.7 x 3.1 cm.

Mammographic images were processed with CAD.

On physical exam, no mass is palpated in the medial right breast.

Targeted ultrasound is performed, showing a focal area of increased
echogenicity with small cysts in the right breast at 3 o'clock, 5 cm
from the nipple, which likely corresponds to the mammographic
opacity and associated calcifications. There is no defined mass.
There is no sonographic architectural distortion.

Sonographic evaluation of the right axilla shows no enlarged or
abnormal lymph nodes.
IMPRESSION: Indeterminate asymmetry with associated calcifications in the medial
right breast. Tissue sampling is recommended.

RECOMMENDATION:
Stereotactic core needle biopsy of the asymmetry and associated
calcifications in the medial right breast.

I have discussed the findings and recommendations with the patient.
If applicable, a reminder letter will be sent to the patient
regarding the next appointment.

BI-RADS CATEGORY  4: Suspicious.

## 2021-02-16 IMAGING — MG MM DIGITAL DIAGNOSTIC UNILAT*R* W/ TOMO W/ CAD
6 series · 6 of 14 positions shown · non-contrast
Comparison: Previous exam(s).

CLINICAL DATA: Short-term follow-up. This patient underwent a
stereotactic core needle biopsy of an area of asymmetry and
associated indeterminate calcifications in the right breast on
12/25/2019. Pathology revealed, among multiple benign pathologies, a
complex sclerosing lesion and intraductal papilloma. Surgical
excision was recommended, but a initially has been declined. The
patient is to see her surgeon next week.

EXAM:
DIGITAL DIAGNOSTIC UNILATERAL RIGHT MAMMOGRAM WITH TOMO AND CAD

[R CC]
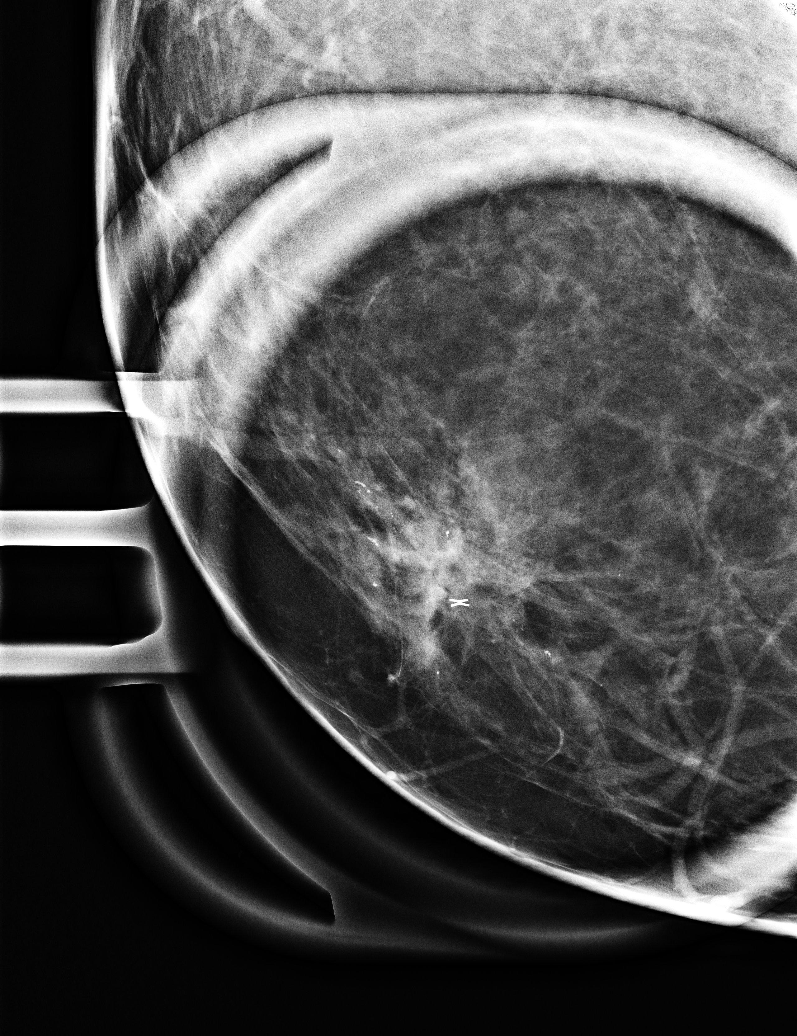

[R ML]
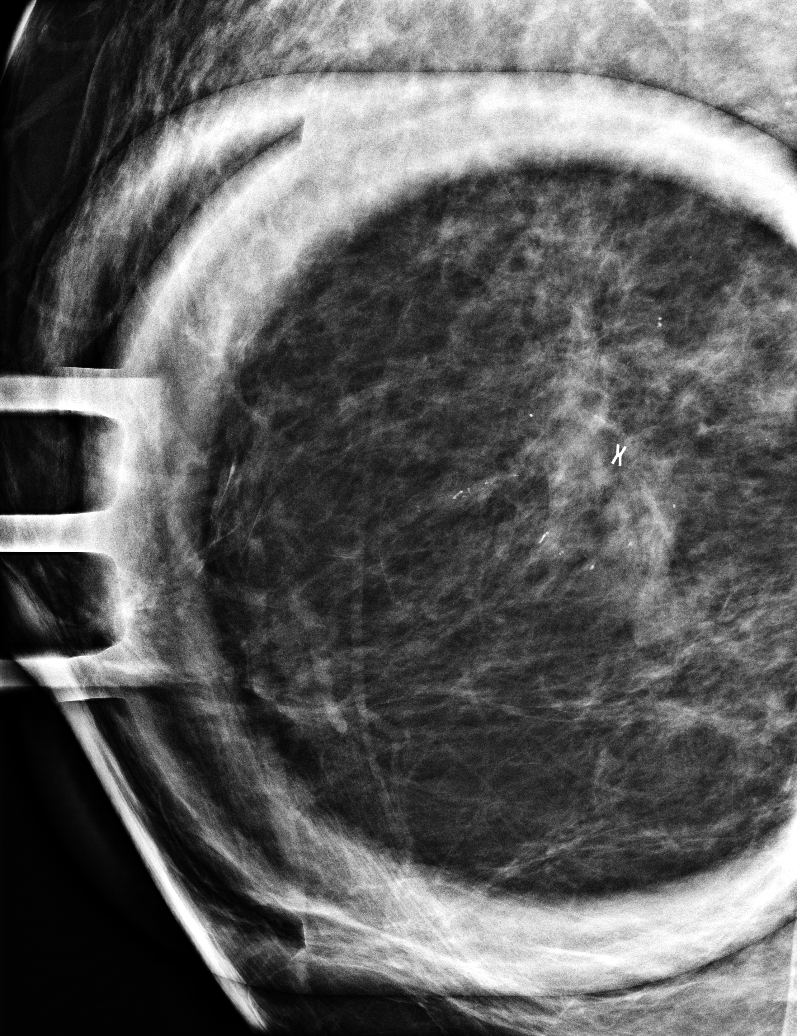

[R CC synth-2D]
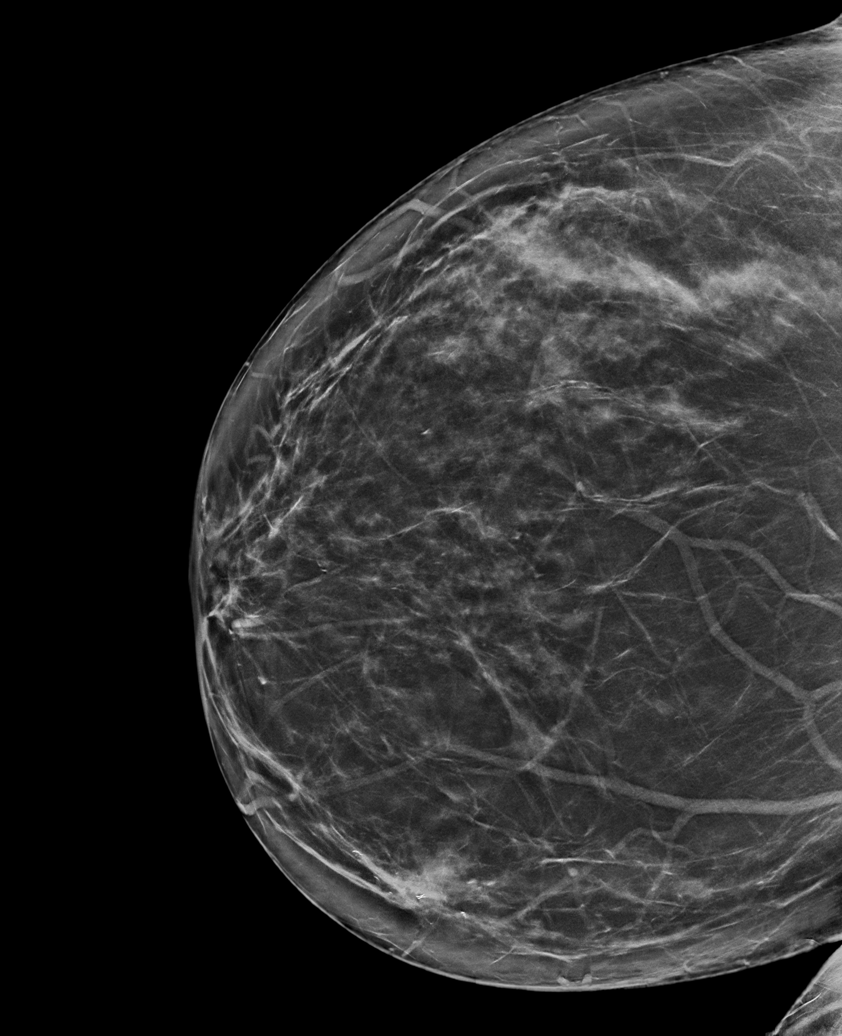

[R MLO synth-2D]
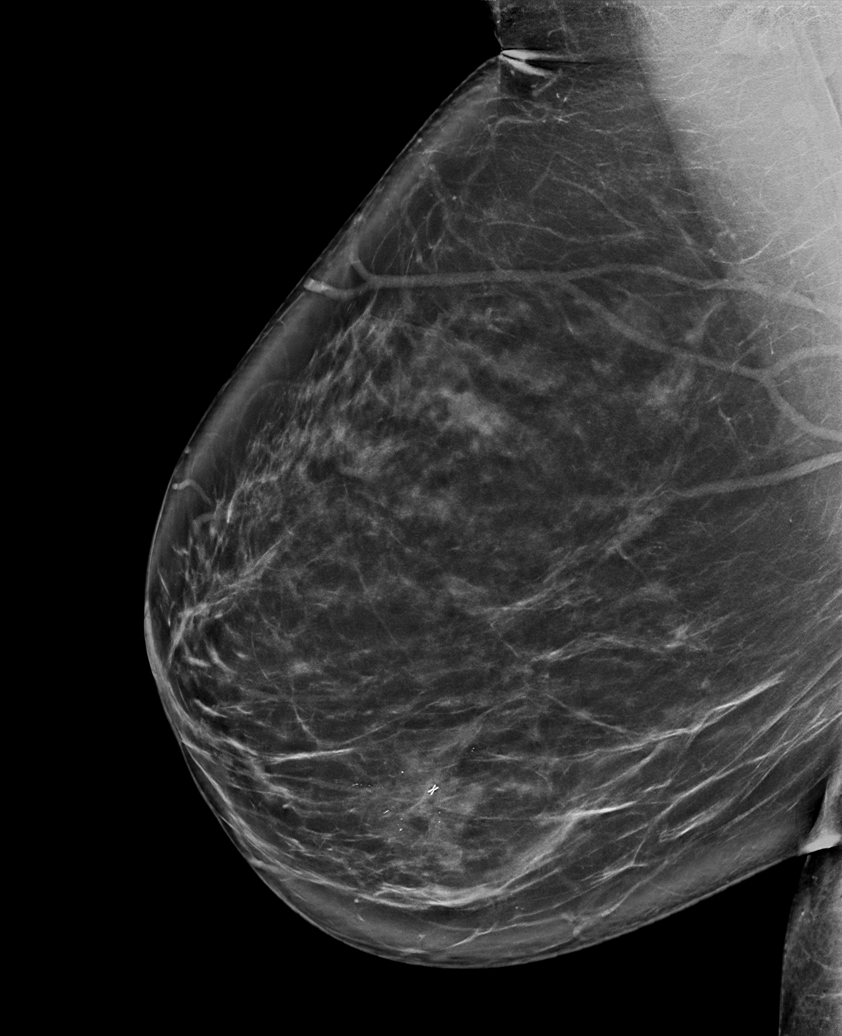

[R MLO tomo · tomo slice 53/104.0]
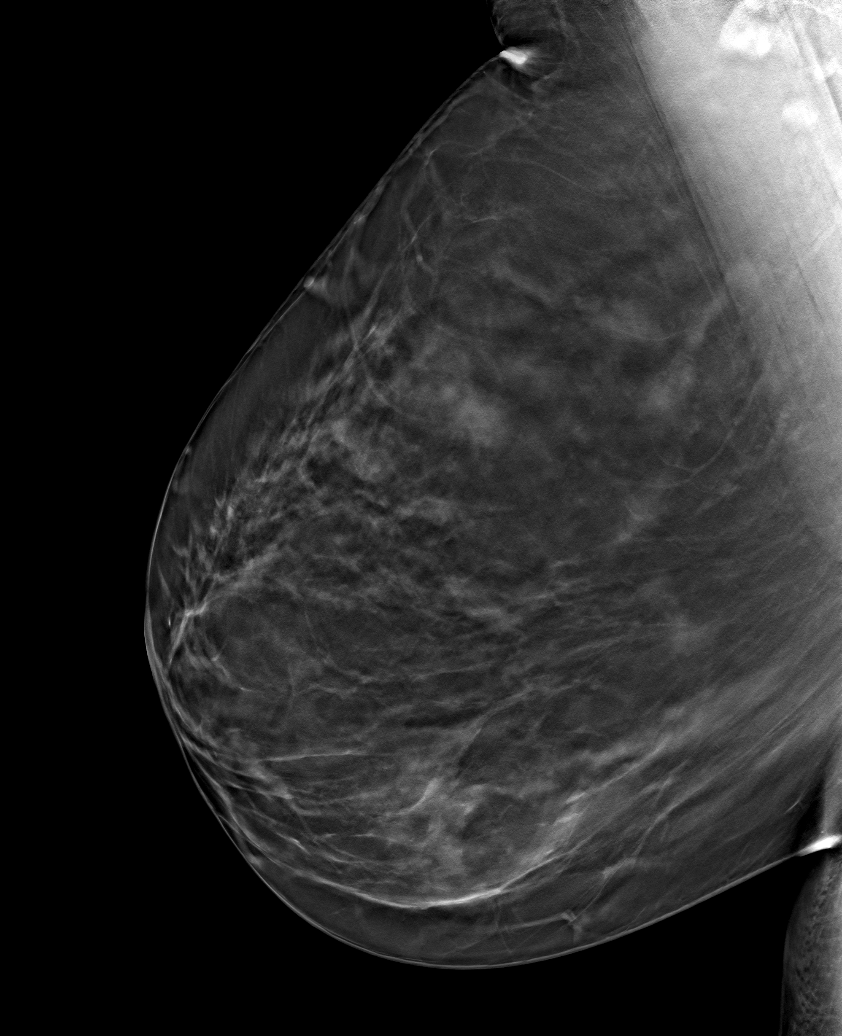

[R CC tomo · tomo slice 45/90.0]
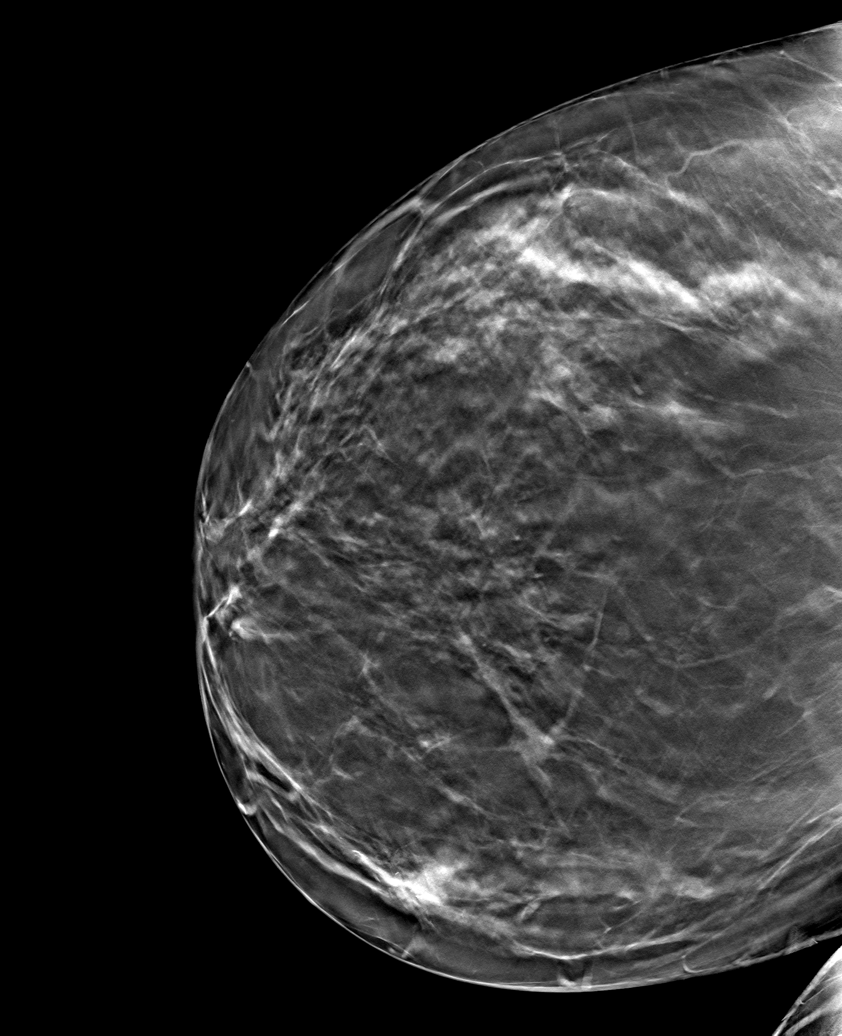

[6 of 14 positions shown; findings below may reference images not displayed]

ACR Breast Density Category b: There are scattered areas of
fibroglandular density.
FINDINGS: The focal area of asymmetry and associated calcifications are
without significant change when compared to the prior exams. An X
shaped biopsy clip lies within the area of asymmetry. There are
multiple residual calcifications spanning a maximum of approximately
3.7 cm. There is no defined mass.

There are no new abnormalities or new calcifications.

Mammographic images were processed with CAD.
IMPRESSION: 1. Previously biopsied area has multiple persistent calcifications
and residual asymmetry. Given the high risk pathology of complex
sclerosing lesion and papilloma, surgical excision is again
recommended.

RECOMMENDATION:
1. Surgical excision of the previously biopsied area containing
papilloma and complex sclerosing lesion as well as multiple residual
calcifications.

I have discussed the findings and recommendations with the patient.
If applicable, a reminder letter will be sent to the patient
regarding the next appointment.

BI-RADS CATEGORY  4: Suspicious.

## 2021-10-31 ENCOUNTER — Emergency Department: Payer: Medicaid Other

## 2021-10-31 ENCOUNTER — Other Ambulatory Visit: Payer: Self-pay

## 2021-10-31 ENCOUNTER — Emergency Department
Admission: EM | Admit: 2021-10-31 | Discharge: 2021-10-31 | Disposition: A | Discharge status: Home / Self Care | Payer: Medicaid Other | Attending: Student in an Organized Health Care Education/Training Program | Admitting: Student in an Organized Health Care Education/Training Program

## 2021-10-31 ENCOUNTER — Encounter: Payer: Self-pay | Admitting: Emergency Medicine

## 2021-10-31 DIAGNOSIS — R102 Pelvic and perineal pain: Secondary | ICD-10-CM | POA: Diagnosis not present

## 2021-10-31 DIAGNOSIS — R1031 Right lower quadrant pain: Secondary | ICD-10-CM | POA: Diagnosis not present

## 2021-10-31 LAB — POC URINE PREG, ED: Preg Test, Ur: NEGATIVE

## 2021-10-31 LAB — CBC WITH DIFFERENTIAL/PLATELET
Abs Immature Granulocytes: 0.01 10*3/uL (ref 0.00–0.07)
Basophils Absolute: 0 10*3/uL (ref 0.0–0.1)
Basophils Relative: 0 %
Eosinophils Absolute: 0.1 10*3/uL (ref 0.0–0.5)
Eosinophils Relative: 2 %
HCT: 40.1 % (ref 36.0–46.0)
Hemoglobin: 13.1 g/dL (ref 12.0–15.0)
Immature Granulocytes: 0 %
Lymphocytes Relative: 29 %
Lymphs Abs: 1.3 10*3/uL (ref 0.7–4.0)
MCH: 29 pg (ref 26.0–34.0)
MCHC: 32.7 g/dL (ref 30.0–36.0)
MCV: 88.7 fL (ref 80.0–100.0)
Monocytes Absolute: 0.6 10*3/uL (ref 0.1–1.0)
Monocytes Relative: 14 %
Neutro Abs: 2.3 10*3/uL (ref 1.7–7.7)
Neutrophils Relative %: 55 %
Platelets: 273 10*3/uL (ref 150–400)
RBC: 4.52 MIL/uL (ref 3.87–5.11)
RDW: 12.4 % (ref 11.5–15.5)
WBC: 4.3 10*3/uL (ref 4.0–10.5)
nRBC: 0 % (ref 0.0–0.2)

## 2021-10-31 LAB — URINALYSIS, ROUTINE W REFLEX MICROSCOPIC
Bacteria, UA: NONE SEEN
Bilirubin Urine: NEGATIVE
Glucose, UA: NEGATIVE mg/dL
Hgb urine dipstick: NEGATIVE
Ketones, ur: NEGATIVE mg/dL
Nitrite: NEGATIVE
Protein, ur: NEGATIVE mg/dL
Specific Gravity, Urine: 1.031 — ABNORMAL HIGH (ref 1.005–1.030)
pH: 5 (ref 5.0–8.0)

## 2021-10-31 LAB — BASIC METABOLIC PANEL
Anion gap: 7 (ref 5–15)
BUN: 13 mg/dL (ref 6–20)
CO2: 26 mmol/L (ref 22–32)
Calcium: 9.4 mg/dL (ref 8.9–10.3)
Chloride: 107 mmol/L (ref 98–111)
Creatinine, Ser: 1.02 mg/dL — ABNORMAL HIGH (ref 0.44–1.00)
GFR, Estimated: >60 mL/min (ref >60)
Glucose, Bld: 93 mg/dL (ref 70–99)
Potassium: 3.9 mmol/L (ref 3.5–5.1)
Sodium: 140 mmol/L (ref 135–145)

## 2021-10-31 MED ORDER — IOHEXOL 300 MG/ML  SOLN
80.0000 mL | Freq: Once | INTRAMUSCULAR | Status: AC | PRN
  Administered 2021-10-31: 80 mL via INTRAVENOUS
  Filled 2021-10-31: qty 80

## 2021-10-31 MED ORDER — KETOROLAC TROMETHAMINE 30 MG/ML IJ SOLN
15.0000 mg | Freq: Once | INTRAMUSCULAR | Status: AC
  Administered 2021-10-31: 15 mg via INTRAVENOUS
  Filled 2021-10-31: qty 1

## 2021-10-31 NOTE — ED Triage Notes (Signed)
Pt here with abd pain that started a few weeks ago. Pt states she has fibroids. Pt denies N/V/D.  ?

## 2021-10-31 NOTE — ED Notes (Signed)
See triage note  presents with pain to right side of abd   states pain is mainly lower abd  hx of fibroids  denies any fever or n/v ?

## 2021-10-31 NOTE — Discharge Instructions (Addendum)
Please follow up with gynecology. ? ?Take Aleve 2 times per day if needed for pain. ? ?Return to the ER for symptoms that change or worsen or for new concerns if unable to schedule an appointment. ?

## 2021-10-31 NOTE — ED Provider Notes (Signed)
? ?[facility] ?Provider Note ? ? ? Event Date/Time  ? First MD Initiated Contact with Patient 10/31/21 [phone]   ?  (approximate) ? ? ?History  ? ?Abdominal Pain ? ? ?HPI ? ?Janet Ayala is a 46 y.o. female with history of uterine fibroids and as listed in EMR presents to the emergency department for treatment and evaluation of right lower quadrant pain.  Pain started a week or so before her menstrual cycle. She finished 4 days ago and pain has increased. Cycle was not heavier or longer than usual. She is not currently on any type of birth control, but does not believe she could be pregnant. She has had pain in the same location several times over the past few years. Previously diagnosed with fibroid. ? ?  ? ? ?Physical Exam  ? ?Triage Vital Signs: ?ED Triage Vitals  ?Enc Vitals Group  ?   BP 10/31/21 0846 132/81  ?   Pulse Rate 10/31/21 0846 79  ?   Resp 10/31/21 0846 20  ?   Temp 10/31/21 0846 97.8 ?F (36.6 ?C)  ?   Temp src --   ?   SpO2 10/31/21 0846 99 %  ?   Weight 10/31/21 0846 217 lb (98.4 kg)  ?   Height 10/31/21 0846 '5\' 7"'$  (1.702 m)  ?   Head Circumference --   ?   Peak Flow --   ?   Pain Score 10/31/21 0845 9  ?   Pain Loc --   ?   Pain Edu? --   ?   Excl. in [place]? --   ? ? ?Most recent vital signs: ?Vitals:  ? 10/31/21 0846 10/31/21 1200  ?BP: 132/81 130/78  ?Pulse: 79 80  ?Resp: 20 18  ?Temp: 97.8 ?F (36.6 ?C)   ?SpO2: 99% 99%  ? ? ?General: Awake, no distress.  ?CV:  Good peripheral perfusion.  ?Resp:  Normal effort.  ?Abd:  No distention.  ?Other:  Tender in  right lower abdomen/pelvic area ? ? ?ED Results / Procedures / Treatments  ? ?Labs ?(all labs ordered are listed, but only abnormal results are displayed) ?Labs Reviewed  ?BASIC METABOLIC PANEL - Abnormal; Notable for the following components:  ?    Result Value  ? Creatinine, Ser 1.02 (*)   ? All other components within normal limits  ?URINALYSIS, ROUTINE W REFLEX MICROSCOPIC - Abnormal; Notable for the following components:   ? Color, Urine AMBER (*)   ? APPearance HAZY (*)   ? Specific Gravity, Urine 1.031 (*)   ? Leukocytes,Ua MODERATE (*)   ? All other components within normal limits  ?CBC WITH DIFFERENTIAL/PLATELET  ?POC URINE PREG, ED  ? ? ? ?EKG ? ?Not indicated ? ? ?RADIOLOGY ? ?Image and radiology report reviewed by me. ? ?Pelvic ultrasound negative for acute findings. ? ?CT of the abdomen pelvis with contrast negative for acute concerns but does show an area consistent with fibroid or endometriosis. ? ?PROCEDURES: ? ?Critical Care performed: No ? ?Procedures ? ? ?MEDICATIONS ORDERED IN ED: ?Medications  ?ketorolac (TORADOL) 30 MG/ML injection 15 mg (15 mg Intravenous Given 10/31/21 0932)  ?iohexol (OMNIPAQUE) 300 MG/ML solution 80 mL (80 mLs Intravenous Contrast Given 10/31/21 1154)  ? ? ? ?IMPRESSION / MDM / ASSESSMENT AND PLAN / ED COURSE  ? ?I have reviewed the triage note. ? ?Differential diagnosis includes, but is not limited to: Ovarian cyst, ovarian torsion, endometriosis, appendicitis. ? ?Endometrioma in right adnexal region  5177. ? ?46 year old female presenting to the emergency department for treatment and evaluation of right-sided pelvic pain that is similar to previous diagnosis of endometriosis/fibroid.  See HPI for further details. ? ?Labs are all over reassuring.  There is no leukocytosis, BMP is unremarkable, pregnancy test is negative, urinalysis does have moderate leuks with 6-10 white blood cells with no bacteria and 6-10 squamous epithelial cells.  Patient is not having any urinary symptoms.  ? ?Ultrasound of the pelvis shows no acute findings.  CT abdomen and pelvis shows small area concerning for fibroid or endometriosis. ? ?All results discussed with the patient.  Plan will be to have her take Aleve 2 times per day when she is having pain and call and schedule follow-up appointment with gynecology.  If symptoms change or worsen and she is unable to see primary care or the OB/GYN she is to return to the  emergency department. ? ?  ? ? ?FINAL CLINICAL IMPRESSION(S) / ED DIAGNOSES  ? ?Final diagnoses:  ?Pelvic pain in female  ? ? ? ?Rx / DC Orders  ? ?ED Discharge Orders   ? ? None  ? ?  ? ? ? ?Note:  This document was prepared using Dragon voice recognition software and may include unintentional dictation errors. ?  ?Victorino Dike, FNP ?10/31/21 1310 ? ?  ?Merlyn Lot, MD ?10/31/21 1447 ? ?

## 2021-11-23 ENCOUNTER — Encounter: Payer: Self-pay | Admitting: Family Medicine

## 2021-11-23 ENCOUNTER — Ambulatory Visit (INDEPENDENT_AMBULATORY_CARE_PROVIDER_SITE_OTHER): Payer: Medicaid Other | Admitting: Family Medicine

## 2021-11-23 ENCOUNTER — Other Ambulatory Visit (HOSPITAL_COMMUNITY)
Admission: EM | Admit: 2021-11-23 | Discharge: 2021-11-23 | Disposition: A | Discharge status: Home / Self Care | Payer: Medicaid Other | Source: Ambulatory Visit | Attending: Family Medicine | Admitting: Family Medicine

## 2021-11-23 VITALS — BP 118/60 | Ht 67.0 in | Wt 230.0 lb

## 2021-11-23 DIAGNOSIS — R102 Pelvic and perineal pain: Secondary | ICD-10-CM | POA: Diagnosis present

## 2021-11-23 DIAGNOSIS — Z124 Encounter for screening for malignant neoplasm of cervix: Secondary | ICD-10-CM | POA: Diagnosis present

## 2021-11-23 DIAGNOSIS — N809 Endometriosis, unspecified: Secondary | ICD-10-CM

## 2021-11-23 NOTE — Progress Notes (Addendum)
   GYNECOLOGY PROBLEM  VISIT ENCOUNTER NOTE  Subjective:   Janet Ayala is a 46 y.o. [id number] female here for a problem GYN visit.  Current complaints: Seen in ER for pain with menses. Reports very painful cycles for most of her life with cramping, heavy bleeding and clots.   She has been on depo in the past.  She is not sexually active currently, nor has she been since her ER visit.   Denies abnormal vaginal bleeding, discharge, pelvic pain, problems with intercourse or other gynecologic concerns.    Gynecologic History Patient's last menstrual period was 11/18/2021.  Contraception: none  Health Maintenance Due  Topic Date Due   COVID-19 Vaccine (1) Never done   HIV Screening  Never done   Hepatitis C Screening  Never done   TETANUS/TDAP  Never done   PAP SMEAR-Modifier  Never done    The following portions of the patient's history were reviewed and updated as appropriate: allergies, current medications, past family history, past medical history, past social history, past surgical history and problem list.  Review of Systems Pertinent items are noted in HPI.   Objective:  BP 118/60   Ht '5\' 7"'$  (1.702 m)   Wt 230 lb (104.3 kg)   LMP 11/18/2021   BMI 36.02 kg/m  Gen: well appearing, NAD HEENT: no scleral icterus CV: RR Lung: Normal WOB Ext: warm well perfused  PELVIC: Normal appearing external genitalia; normal appearing vaginal mucosa and cervix.  No abnormal discharge noted.  Pap smear obtained.  Normal uterine size, no other palpable masses, no uterine or adnexal tenderness.   Assessment and Plan:   1. Pelvic pain - Cytology - PAP  2. Cervical cancer screening - Cytology - PAP  3. Endometriosis Most likely etiology of dysmenorrhea Discussed options-- hormonal control with OCP, IUD insertion, and orlissa. Patient is currently scared of having hysterectomy which is reasonable and at this point not necessary given she has not tried other option Patient most  interested in [facility], given brochure to read  Discussed most common SE of medication  Reviewed in detail that the patient would need pregnancy prevention method if she becomes sexually active If patient calls or messages we can start Orlissa  Please refer to After Visit Summary for other counseling recommendations.   Return if symptoms worsen or fail to improve, for dysmenorrhea/medication start.  No future appointments.  Caren Macadam, MD, MPH, ABFM Attending [place] for [facility]

## 2021-11-25 ENCOUNTER — Encounter: Payer: Self-pay | Admitting: Family Medicine

## 2021-11-25 LAB — CYTOLOGY - PAP
Comment: NEGATIVE
Diagnosis: NEGATIVE
High risk HPV: NEGATIVE

## 2021-11-26 ENCOUNTER — Other Ambulatory Visit: Payer: Self-pay | Admitting: Licensed Practical Nurse

## 2021-11-26 DIAGNOSIS — N809 Endometriosis, unspecified: Secondary | ICD-10-CM

## 2021-11-26 DIAGNOSIS — N946 Dysmenorrhea, unspecified: Secondary | ICD-10-CM

## 2021-11-26 MED ORDER — ORILISSA 150 MG PO TABS: 150.0000 mg | ORAL_TABLET | Freq: Every day | ORAL | 1 refills | Status: DC

## 2021-11-26 NOTE — Addendum Note (Signed)
Addended by: Caryl Bis on: 11/26/2021 11:37 AM   Modules accepted: Orders

## 2021-11-26 NOTE — Progress Notes (Signed)
Pt messaged requesting Orlissa.  Per Dr Romona Curls note, ok to send script if pt desires. Script sent to [facility]. Mychart message sent to pt to follow up with Dr Ernestina Patches.  Roberto Scales, CNM  [facility]os[facility] Pigeon, [facility]  11/26/21  8:09 AM

## 2021-11-30 NOTE — Progress Notes (Signed)
Pt has seend KNN's msg on MyChart.

## 2022-01-28 ENCOUNTER — Ambulatory Visit (INDEPENDENT_AMBULATORY_CARE_PROVIDER_SITE_OTHER): Payer: Medicaid Other | Admitting: Obstetrics and Gynecology

## 2022-01-28 ENCOUNTER — Encounter: Payer: Self-pay | Admitting: Obstetrics and Gynecology

## 2022-01-28 VITALS — BP 120/82 | Ht 67.0 in | Wt 223.8 lb

## 2022-01-28 DIAGNOSIS — N946 Dysmenorrhea, unspecified: Secondary | ICD-10-CM

## 2022-01-28 DIAGNOSIS — N809 Endometriosis, unspecified: Secondary | ICD-10-CM

## 2022-01-28 DIAGNOSIS — R102 Pelvic and perineal pain: Secondary | ICD-10-CM

## 2022-01-28 DIAGNOSIS — D219 Benign neoplasm of connective and other soft tissue, unspecified: Secondary | ICD-10-CM

## 2022-01-28 NOTE — Progress Notes (Signed)
HPI:      Janet Ayala is a 46 y.o. [id number] who LMP was Janet Ayala's last menstrual period was 01/10/2022 (approximate).  Subjective:   She presents today because she has a history of heavy bleeding and cramping with her menses.  She does have regular menses but the first 2 days she describes as very painful and sometimes disabling.  She works from home and so is able to get some work done but is limited during the first few days of her menses.  It is likely that if she had to go to work that she would miss days. She has a history of uterine fibroids and possible endometriosis noted by CT and ultrasound.  She previously tried and Depo-Provera without success and this caused significant weight gain and she is not willing to retry. She tried a short course of Freida Busman also unsuccessful. She has had Mirena IUD in the past and had to have it removed because of significant cramping. She states that because she has tried all these other things and nothing has been successful she would like to consider definitive management with hysterectomy.    Hx: The following portions of the Janet Ayala's history were reviewed and updated as appropriate:             She  has a past medical history of Fibroids. She does not have any pertinent problems on file. She  has a past surgical history that includes Breast biopsy (Right, 12/25/2019). Her family history is not on file. She  reports that she quit smoking about 15 years ago. Her smoking use included cigarettes. She has never used smokeless tobacco. She reports current alcohol use. She reports that she does not use drugs. She currently has no medications in their medication list. She is allergic to sulfa antibiotics.       Review of Systems:  Review of Systems  Constitutional: Denied constitutional symptoms, night sweats, recent illness, fatigue, fever, insomnia and weight loss.  Eyes: Denied eye symptoms, eye pain, photophobia, vision change and visual  disturbance.  Ears/Nose/Throat/Neck: Denied ear, nose, throat or neck symptoms, hearing loss, nasal discharge, sinus congestion and sore throat.  Cardiovascular: Denied cardiovascular symptoms, arrhythmia, chest pain/pressure, edema, exercise intolerance, orthopnea and palpitations.  Respiratory: Denied pulmonary symptoms, asthma, pleuritic pain, productive sputum, cough, dyspnea and wheezing.  Gastrointestinal: Denied, gastro-esophageal reflux, melena, nausea and vomiting.  Genitourinary: See HPI for additional information.  Musculoskeletal: Denied musculoskeletal symptoms, stiffness, swelling, muscle weakness and myalgia.  Dermatologic: Denied dermatology symptoms, rash and scar.  Neurologic: Denied neurology symptoms, dizziness, headache, neck pain and syncope.  Psychiatric: Denied psychiatric symptoms, anxiety and depression.  Endocrine: Denied endocrine symptoms including hot flashes and night sweats.   Meds:   No current outpatient medications on file prior to visit.   No current facility-administered medications on file prior to visit.      Objective:     Vitals:   01/28/22 0909  BP: 120/82   Filed Weights   01/28/22 0909  Weight: 223 lb 12.8 oz (101.5 kg)              Ultrasound shows fundal fibroid and possible endometriotic lesion.          Assessment:    [id number] Janet Ayala Active Problem List   Diagnosis Date Noted   Adnexal mass 04/11/2018   Pelvic pain 04/11/2018     1. Endometriosis   2. Painful menstrual periods   3. Pelvic pain   4. Fibroid  Janet Ayala with nonsurgically diagnosed endometriosis and uterine fibroids.  It is likely that these are the sources of her significant dysmenorrhea.  She has failed multiple hormonal agents including Freida Busman and IUD for management of her pain.  She would like definitive management with hysterectomy.   Plan:            1.  We discussed multiple options including fibroid embolization as well as use of IUD  [place] Depo.  The risks and benefits of surgery were discussed.  She is convinced she wants a hysterectomy.  She will schedule her next visit as a preop once she has decided upon timing of surgery. Orders No orders of the defined types were placed in this encounter.   No orders of the defined types were placed in this encounter.     F/U  No follow-ups on file. I spent 42 minutes involved in the care of this Janet Ayala preparing to see the Janet Ayala by obtaining and reviewing her medical history (including labs, imaging tests and prior procedures), documenting clinical information in the electronic health record (EHR), counseling and coordinating care plans, writing and sending prescriptions, ordering tests or procedures and in direct communicating with the Janet Ayala and medical staff discussing pertinent items from her history and physical exam.  Finis Bud, M.D. 01/28/2022 9:49 AM

## 2022-01-28 NOTE — Progress Notes (Signed)
Patient presents today to discuss hysterectomy. She states severe cramping with regular periods. Patient stopped taking Orlissa shortly after starting due to "it not working". No other concerns at this time.

## 2022-03-09 ENCOUNTER — Other Ambulatory Visit: Payer: Self-pay | Admitting: Family Medicine

## 2022-03-09 DIAGNOSIS — Z1231 Encounter for screening mammogram for malignant neoplasm of breast: Secondary | ICD-10-CM

## 2022-06-12 IMAGING — CT CT ABD-PELV W/ CM
2 of 5 series · 16 of 46 positions shown, 18 images · IV contrast (APPLIED)
Comparison: 04/09/2018

CLINICAL DATA: RIGHT lower quadrant abdominal pain, history of
fibroids

EXAM:
CT ABDOMEN AND PELVIS WITH CONTRAST
TECHNIQUE: Multidetector CT imaging of the abdomen and pelvis was performed
using the standard protocol following bolus administration of
intravenous contrast.

[Series 2: abdomen 5.0 · axial · 0.71mm/px · z∈[-908,-488]mm · 13 of 98 slices shown, 15 images]
[im 7/98  soft-tissue]
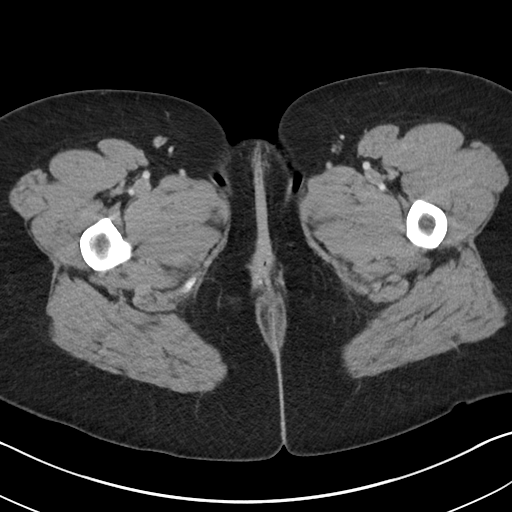
[im 7/98  bone]
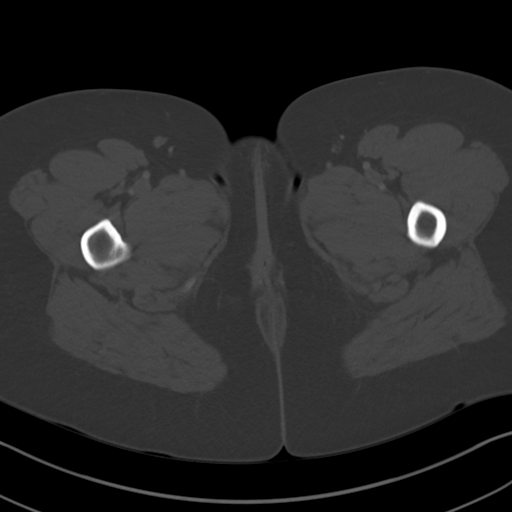
[im 14/98  soft-tissue]
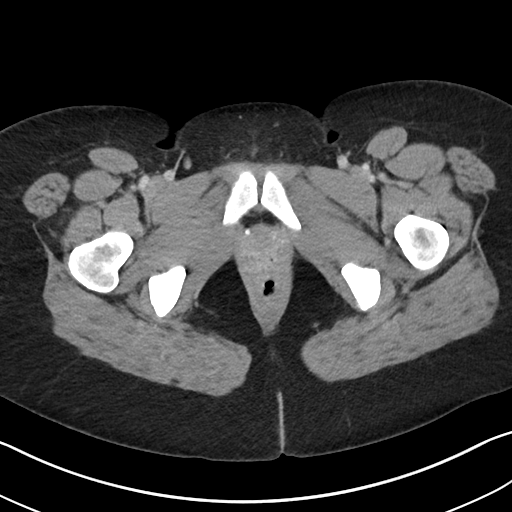
[im 21/98  soft-tissue]
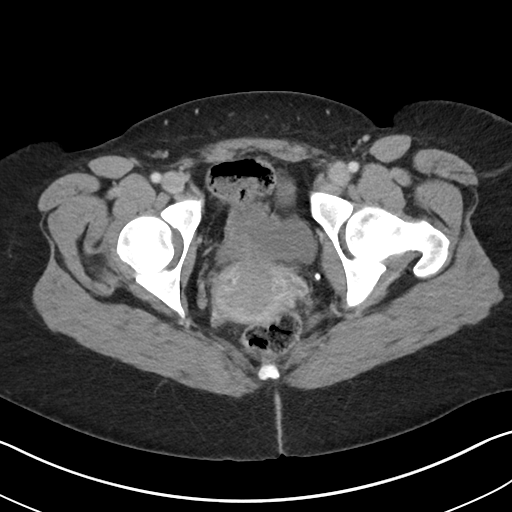
[im 28/98  soft-tissue]
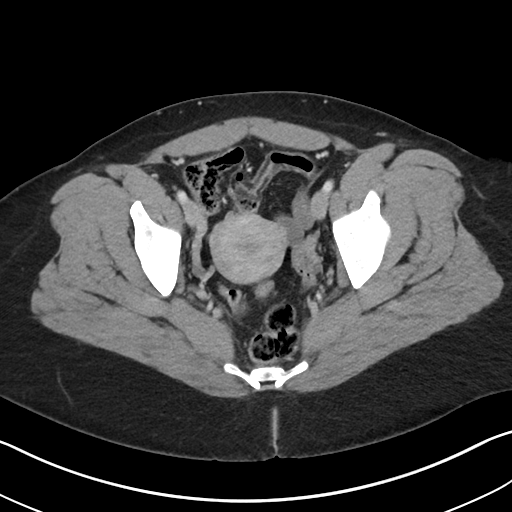
[im 35/98  soft-tissue]
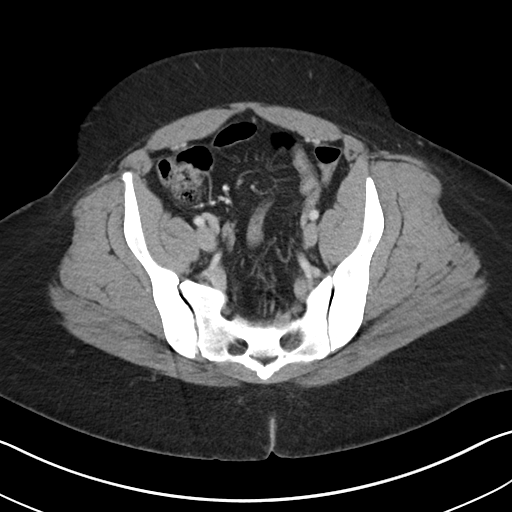
[im 42/98  soft-tissue]
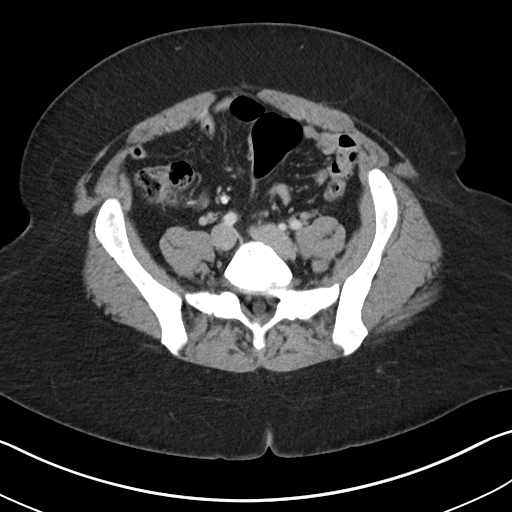
[im 49/98  soft-tissue]
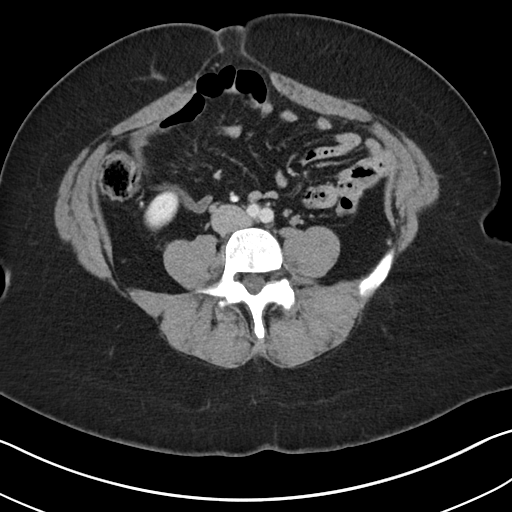
[im 56/98  soft-tissue]
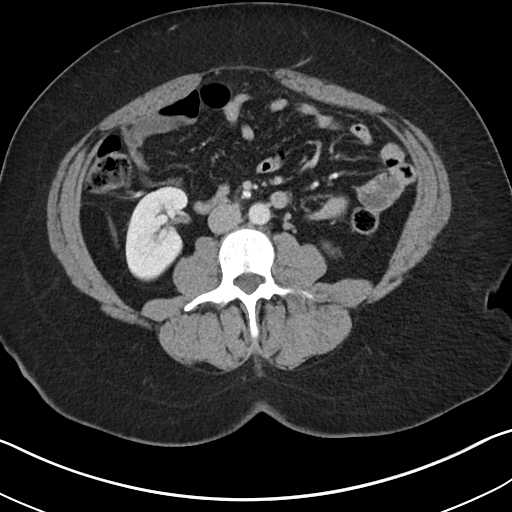
[im 63/98  soft-tissue]
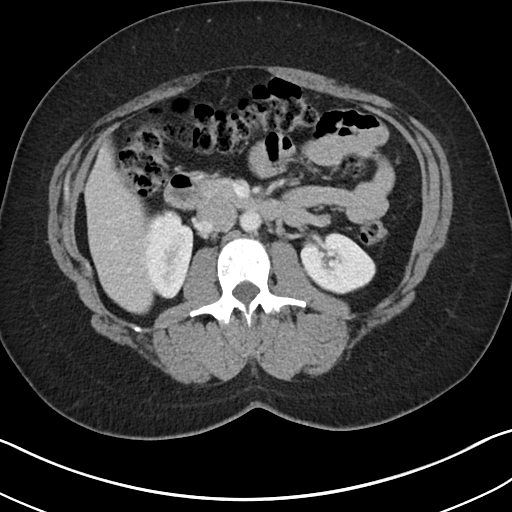
[im 63/98  bone]
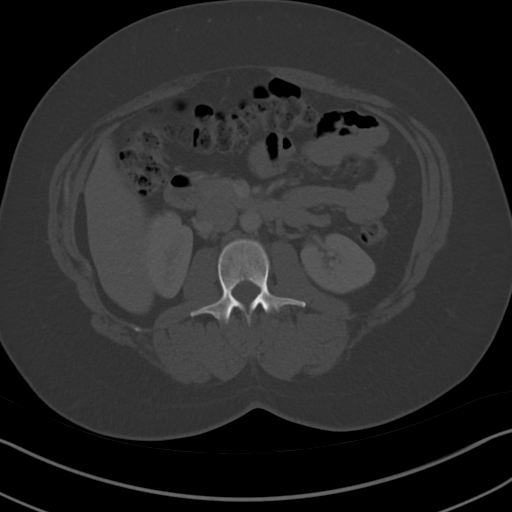
[im 70/98  soft-tissue]
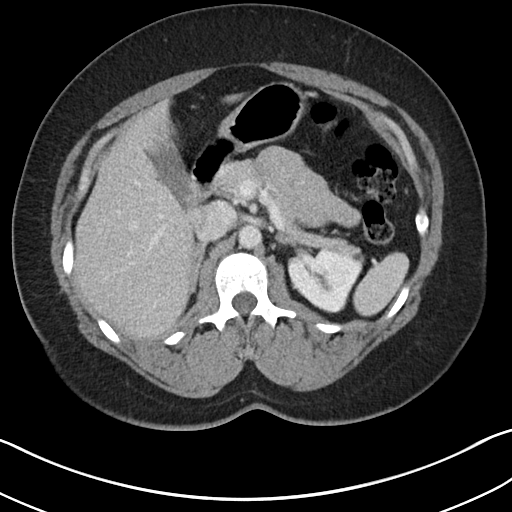
[im 77/98  soft-tissue]
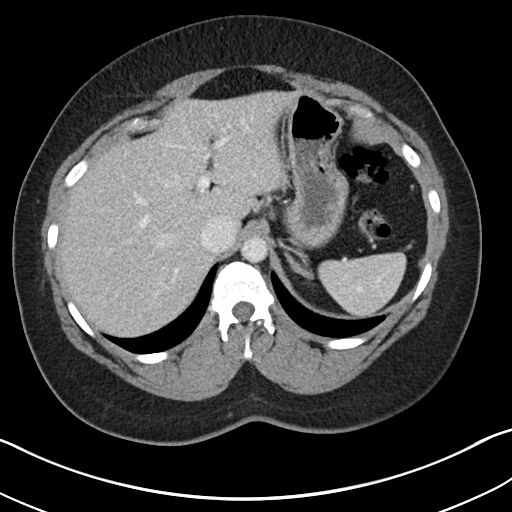
[im 84/98  soft-tissue]
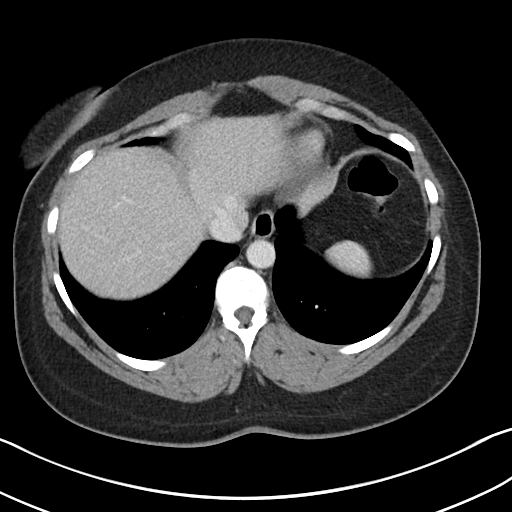
[im 91/98  soft-tissue]
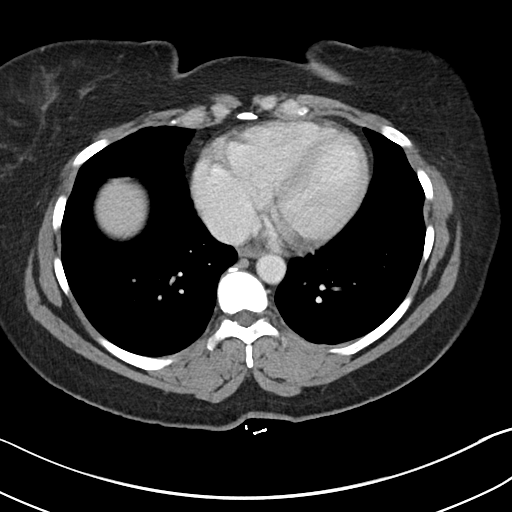

[Series 5: abdomen 3.0 mpr cor · coronal · 0.81mm/px · 3 of 100 slices shown]
[im 34/100  soft-tissue]
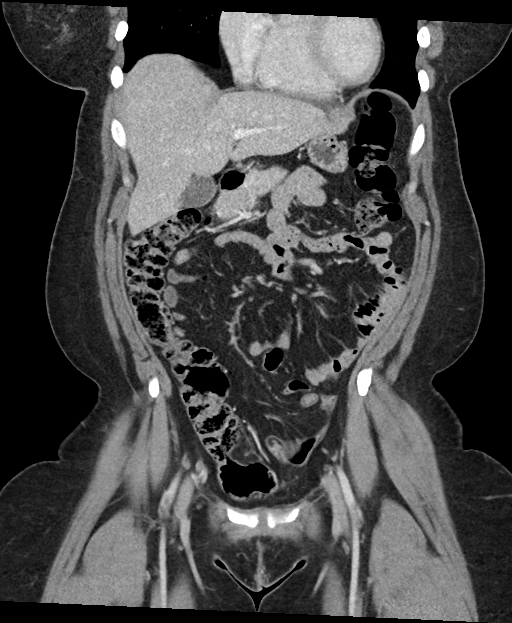
[im 45/100  soft-tissue]
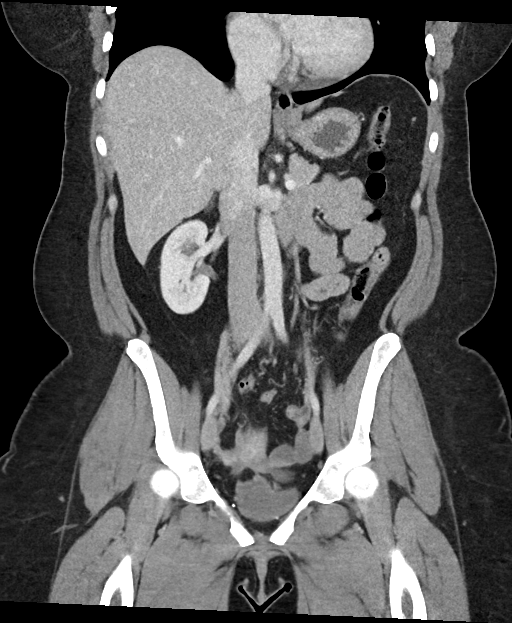
[im 56/100  soft-tissue]
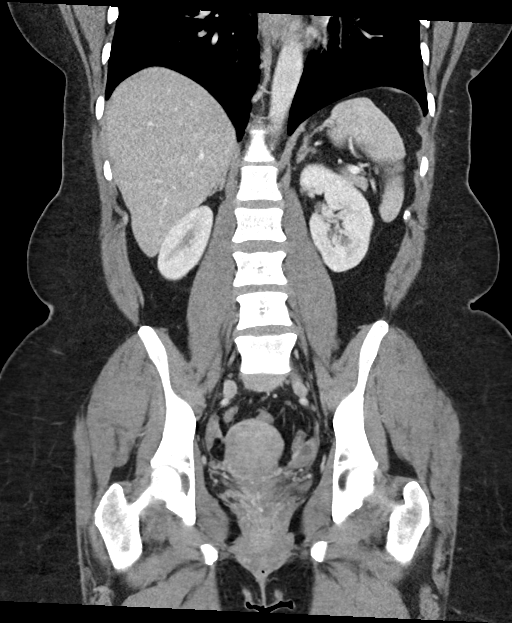

[16 of 46 positions shown; findings below may reference images not displayed]

RADIATION DOSE REDUCTION: This exam was performed according to the
departmental dose-optimization program which includes automated
exposure control, adjustment of the mA and/or kV according to
patient size and/or use of iterative reconstruction technique.

CONTRAST:  80mL OMNIPAQUE IOHEXOL 300 MG/ML SOLN IV. No oral
contrast.
FINDINGS: Lower chest: Lung bases clear

Hepatobiliary: Gallbladder and liver normal appearance

Pancreas: Normal appearance

Spleen: Normal appearance

Adrenals/Urinary Tract: Adrenal glands, kidneys, ureters, and
bladder normal appearance

Stomach/Bowel: Normal appendix. Stomach and bowel loops normal
appearance.

Vascular/Lymphatic: Vascular structures patent.  No adenopathy.

Reproductive: Unremarkable ovaries. Small low-attenuation nodule or
collection identified adjacent to anterior uterus just RIGHT of
midline 1.7 cm diameter, previously 2.5 cm, could represent an
exophytic leiomyoma, endometriosis, or other residual fluid
collection. No additional uterine abnormalities.

Other: Trace nonspecific free pelvic fluid. No free air. Umbilical
hernia containing fat. No no inflammatory process.

Musculoskeletal: Osseous structures unremarkable.
IMPRESSION: Umbilical hernia containing fat.

Small low-attenuation nodule or collection adjacent to anterior
uterus just RIGHT of midline 1.7 cm diameter, previously 2.5 cm,
could represent an exophytic degenerated leiomyoma, endometriosis,
or other nonspecific residual fluid collection.

No new intra-abdominal or intrapelvic abnormalities.

## 2023-06-13 LAB — LAB REPORT - SCANNED
EGFR: 75
HM HIV Screening: NEGATIVE

## 2023-06-20 ENCOUNTER — Other Ambulatory Visit: Payer: Self-pay | Admitting: *Deleted

## 2023-06-20 ENCOUNTER — Other Ambulatory Visit: Payer: Self-pay | Admitting: Family Medicine

## 2023-06-20 ENCOUNTER — Telehealth: Payer: Self-pay | Admitting: *Deleted

## 2023-06-20 DIAGNOSIS — Z1211 Encounter for screening for malignant neoplasm of colon: Secondary | ICD-10-CM

## 2023-06-20 DIAGNOSIS — Z1231 Encounter for screening mammogram for malignant neoplasm of breast: Secondary | ICD-10-CM

## 2023-06-20 MED ORDER — PEG 3350-KCL-NABCB-NACL-NASULF 236 G PO SOLR: 4000.0000 mL | Freq: Once | ORAL | 0 refills | Status: AC

## 2023-06-20 NOTE — Telephone Encounter (Signed)
Gastroenterology Pre-Procedure Review  Request Date: 07/20/2023 Requesting Physician: Dr. Allegra Lai  PATIENT REVIEW QUESTIONS: The patient responded to the following health history questions as indicated:    1. Are you having any GI issues? no 2. Do you have a personal history of Polyps? no 3. Do you have a family history of Colon Cancer or Polyps? no 4. Diabetes Mellitus? no 5. Joint replacements in the past 12 months?no 6. Major health problems in the past 3 months?no 7. Any artificial heart valves, MVP, or defibrillator?no    MEDICATIONS & ALLERGIES:    Patient reports the following regarding taking any anticoagulation/antiplatelet therapy:   Plavix, Coumadin, Eliquis, Xarelto, Lovenox, Pradaxa, Brilinta, or Effient? no Aspirin? no  Patient confirms/reports the following medications:  Current Outpatient Medications  Medication Sig Dispense Refill   polyethylene glycol (GOLYTELY) 236 g solution Take 4,000 mLs by mouth once for 1 dose. 4000 mL 0   No current facility-administered medications for this visit.    Patient confirms/reports the following allergies:  Allergies  Allergen Reactions   Sulfa Antibiotics Rash    No orders of the defined types were placed in this encounter.   AUTHORIZATION INFORMATION Primary Insurance: 1D#: Group #:  Secondary Insurance: 1D#: Group #:  SCHEDULE INFORMATION: Date: 07/31/2023 Time: Location: [facility]

## 2023-07-18 ENCOUNTER — Telehealth: Payer: Self-pay

## 2023-07-18 NOTE — Telephone Encounter (Signed)
 Patient contacted office to r/s her 07/20/23 colonoscopy due to not feeling well.  Colonoscopy has been rescheduled to 08/31/23 with Dr. Allegra Lai still at [facility].  Dava in Endo notified. Referral updated. Instructions updated.  Thanks,  Marcelino Duster CMA

## 2023-07-18 NOTE — Telephone Encounter (Signed)
 Pt lmovm requesting call back to reschedule her procedure on 07/20/2023 due to her being sick

## 2023-08-08 ENCOUNTER — Ambulatory Visit
Admission: RE | Admit: 2023-08-08 | Discharge: 2023-08-08 | Disposition: A | Discharge status: Home / Self Care | Payer: Medicaid Other | Source: Ambulatory Visit | Attending: Family Medicine | Admitting: Family Medicine

## 2023-08-08 DIAGNOSIS — Z1231 Encounter for screening mammogram for malignant neoplasm of breast: Secondary | ICD-10-CM | POA: Diagnosis present

## 2023-08-22 ENCOUNTER — Other Ambulatory Visit: Payer: Self-pay | Admitting: Family Medicine

## 2023-08-22 DIAGNOSIS — R928 Other abnormal and inconclusive findings on diagnostic imaging of breast: Secondary | ICD-10-CM

## 2023-08-29 ENCOUNTER — Inpatient Hospital Stay: Admission: RE | Admit: 2023-08-29 | Payer: Medicaid Other | Source: Ambulatory Visit

## 2023-08-29 ENCOUNTER — Telehealth: Payer: Self-pay

## 2023-08-29 ENCOUNTER — Other Ambulatory Visit: Payer: Medicaid Other

## 2023-08-29 NOTE — Telephone Encounter (Signed)
 Pt left vm to reschedule her colonoscopy. Pt doesn't have her medication, she was eating things she wasn't suppose to and she has a cold. Please return call to reschedule.

## 2023-08-29 NOTE — Telephone Encounter (Signed)
 The patient called in and left a message requesting to reschedule her procedure on Thursday. She preps is not ready yet. She has been eating the food the that she was supposed to avoid, and she is sick with a cold.

## 2023-08-30 NOTE — Telephone Encounter (Signed)
 I have left a message before I left yesterday.  Just left another message again with my direct number.

## 2023-08-31 ENCOUNTER — Ambulatory Visit: Admission: RE | Admit: 2023-08-31 | Payer: Medicaid Other | Source: Home / Self Care | Admitting: Gastroenterology

## 2023-08-31 SURGERY — COLONOSCOPY WITH PROPOFOL
Anesthesia: General

## 2023-08-31 NOTE — Telephone Encounter (Signed)
 Message left for patient to return my call.

## 2023-09-04 ENCOUNTER — Ambulatory Visit
Admission: RE | Admit: 2023-09-04 | Discharge: 2023-09-04 | Disposition: A | Discharge status: Home / Self Care | Payer: Medicaid Other | Source: Ambulatory Visit | Attending: Family Medicine | Admitting: Family Medicine

## 2023-09-04 DIAGNOSIS — R928 Other abnormal and inconclusive findings on diagnostic imaging of breast: Secondary | ICD-10-CM

## 2023-09-06 ENCOUNTER — Encounter: Payer: Self-pay | Admitting: Family Medicine

## 2023-09-07 ENCOUNTER — Other Ambulatory Visit: Payer: Self-pay | Admitting: Family Medicine

## 2023-09-07 DIAGNOSIS — N6489 Other specified disorders of breast: Secondary | ICD-10-CM

## 2023-09-07 DIAGNOSIS — R928 Other abnormal and inconclusive findings on diagnostic imaging of breast: Secondary | ICD-10-CM

## 2023-09-19 ENCOUNTER — Ambulatory Visit
Admission: RE | Admit: 2023-09-19 | Discharge: 2023-09-19 | Disposition: A | Discharge status: Home / Self Care | Source: Ambulatory Visit | Attending: Family Medicine | Admitting: Family Medicine

## 2023-09-19 ENCOUNTER — Other Ambulatory Visit: Payer: Self-pay | Admitting: Family Medicine

## 2023-09-19 DIAGNOSIS — R928 Other abnormal and inconclusive findings on diagnostic imaging of breast: Secondary | ICD-10-CM

## 2023-09-19 DIAGNOSIS — N6002 Solitary cyst of left breast: Secondary | ICD-10-CM | POA: Insufficient documentation

## 2023-09-19 DIAGNOSIS — N6489 Other specified disorders of breast: Secondary | ICD-10-CM

## 2023-09-19 DIAGNOSIS — D241 Benign neoplasm of right breast: Secondary | ICD-10-CM | POA: Insufficient documentation

## 2023-09-19 HISTORY — PX: BREAST BIOPSY: SHX20

## 2023-09-19 MED ORDER — LIDOCAINE-EPINEPHRINE 1 %-1:100000 IJ SOLN
20.0000 mL | Freq: Once | INTRAMUSCULAR | Status: AC
  Administered 2023-09-19: 20 mL
  Filled 2023-09-19: qty 20

## 2023-09-19 MED ORDER — LIDOCAINE 1 % OPTIME INJ - NO CHARGE
2.0000 mL | Freq: Once | INTRAMUSCULAR | Status: AC
  Administered 2023-09-19: 2 mL
  Filled 2023-09-19: qty 2

## 2023-09-19 MED ORDER — LIDOCAINE-EPINEPHRINE 1 %-1:100000 IJ SOLN
8.0000 mL | Freq: Once | INTRAMUSCULAR | Status: AC
  Administered 2023-09-19: 8 mL

## 2023-09-19 MED ORDER — LIDOCAINE 1 % OPTIME INJ - NO CHARGE
5.0000 mL | Freq: Once | INTRAMUSCULAR | Status: AC
  Administered 2023-09-19: 5 mL
  Filled 2023-09-19: qty 6

## 2023-09-20 ENCOUNTER — Encounter: Payer: Self-pay | Admitting: Internal Medicine

## 2023-09-20 LAB — SURGICAL PATHOLOGY

## 2023-09-26 ENCOUNTER — Other Ambulatory Visit: Payer: Self-pay | Admitting: Family Medicine

## 2023-09-26 ENCOUNTER — Ambulatory Visit
Admission: RE | Admit: 2023-09-26 | Discharge: 2023-09-26 | Disposition: A | Discharge status: Home / Self Care | Source: Ambulatory Visit | Attending: Family Medicine | Admitting: Family Medicine

## 2023-09-26 DIAGNOSIS — R928 Other abnormal and inconclusive findings on diagnostic imaging of breast: Secondary | ICD-10-CM | POA: Diagnosis present

## 2024-01-30 ENCOUNTER — Other Ambulatory Visit: Payer: Self-pay | Admitting: Surgery

## 2024-01-30 DIAGNOSIS — D241 Benign neoplasm of right breast: Secondary | ICD-10-CM

## 2024-03-27 ENCOUNTER — Inpatient Hospital Stay: Admission: RE | Admit: 2024-03-27 | Source: Ambulatory Visit

## 2024-03-27 ENCOUNTER — Encounter
